# Patient Record
Sex: Female | Born: 1935 | Race: White | Hispanic: No | Marital: Married | Smoking: Never smoker
Health system: Southern US, Community
[De-identification: ages and names within clinical notes are randomized; demographics above are authoritative.]

## PROBLEM LIST (undated history)

## (undated) DIAGNOSIS — I2510 Atherosclerotic heart disease of native coronary artery without angina pectoris: Secondary | ICD-10-CM

## (undated) DIAGNOSIS — I639 Cerebral infarction, unspecified: Secondary | ICD-10-CM

## (undated) DIAGNOSIS — F0390 Unspecified dementia without behavioral disturbance: Secondary | ICD-10-CM

## (undated) DIAGNOSIS — F419 Anxiety disorder, unspecified: Secondary | ICD-10-CM

## (undated) DIAGNOSIS — I4891 Unspecified atrial fibrillation: Secondary | ICD-10-CM

---

## 1998-08-13 ENCOUNTER — Other Ambulatory Visit: Admission: RE | Admit: 1998-08-13 | Discharge: 1998-08-13 | Payer: Self-pay | Admitting: *Deleted

## 1999-08-19 ENCOUNTER — Other Ambulatory Visit: Admission: RE | Admit: 1999-08-19 | Discharge: 1999-08-19 | Payer: Self-pay | Admitting: *Deleted

## 1999-09-09 ENCOUNTER — Ambulatory Visit (HOSPITAL_COMMUNITY): Admission: RE | Admit: 1999-09-09 | Discharge: 1999-09-09 | Payer: Self-pay | Admitting: *Deleted

## 2000-10-06 HISTORY — PX: CORONARY ARTERY BYPASS GRAFT: SHX141

## 2000-11-18 ENCOUNTER — Encounter: Admission: RE | Admit: 2000-11-18 | Discharge: 2000-11-18 | Payer: Self-pay | Admitting: Internal Medicine

## 2000-11-18 ENCOUNTER — Encounter: Payer: Self-pay | Admitting: Internal Medicine

## 2001-03-02 ENCOUNTER — Encounter: Payer: Self-pay | Admitting: Emergency Medicine

## 2001-03-03 ENCOUNTER — Encounter: Payer: Self-pay | Admitting: *Deleted

## 2001-03-03 ENCOUNTER — Inpatient Hospital Stay (HOSPITAL_COMMUNITY): Admission: EM | Admit: 2001-03-03 | Discharge: 2001-03-11 | Payer: Self-pay | Admitting: Emergency Medicine

## 2001-03-04 ENCOUNTER — Encounter: Payer: Self-pay | Admitting: Surgery

## 2001-03-05 ENCOUNTER — Encounter: Payer: Self-pay | Admitting: Surgery

## 2001-03-06 ENCOUNTER — Encounter: Payer: Self-pay | Admitting: Surgery

## 2001-03-07 ENCOUNTER — Encounter: Payer: Self-pay | Admitting: Cardiothoracic Surgery

## 2001-03-08 ENCOUNTER — Encounter: Payer: Self-pay | Admitting: Cardiothoracic Surgery

## 2001-06-08 ENCOUNTER — Encounter (HOSPITAL_COMMUNITY): Admission: RE | Admit: 2001-06-08 | Discharge: 2001-09-06 | Payer: Self-pay | Admitting: *Deleted

## 2002-01-13 ENCOUNTER — Encounter: Payer: Self-pay | Admitting: Cardiology

## 2002-01-13 ENCOUNTER — Inpatient Hospital Stay (HOSPITAL_COMMUNITY): Admission: EM | Admit: 2002-01-13 | Discharge: 2002-01-14 | Payer: Self-pay | Admitting: Emergency Medicine

## 2002-05-18 ENCOUNTER — Encounter (INDEPENDENT_AMBULATORY_CARE_PROVIDER_SITE_OTHER): Payer: Self-pay | Admitting: *Deleted

## 2002-05-18 ENCOUNTER — Ambulatory Visit (HOSPITAL_COMMUNITY): Admission: RE | Admit: 2002-05-18 | Discharge: 2002-05-18 | Payer: Self-pay | Admitting: Gastroenterology

## 2003-01-30 ENCOUNTER — Ambulatory Visit (HOSPITAL_COMMUNITY): Admission: RE | Admit: 2003-01-30 | Discharge: 2003-01-30 | Payer: Self-pay | Admitting: Internal Medicine

## 2004-04-23 ENCOUNTER — Ambulatory Visit (HOSPITAL_COMMUNITY): Admission: RE | Admit: 2004-04-23 | Discharge: 2004-04-23 | Payer: Self-pay | Admitting: Internal Medicine

## 2004-11-20 ENCOUNTER — Encounter: Admission: RE | Admit: 2004-11-20 | Discharge: 2004-11-20 | Payer: Self-pay | Admitting: Internal Medicine

## 2005-05-23 ENCOUNTER — Emergency Department (HOSPITAL_COMMUNITY): Admission: EM | Admit: 2005-05-23 | Discharge: 2005-05-23 | Payer: Self-pay | Admitting: Family Medicine

## 2005-08-07 ENCOUNTER — Encounter: Admission: RE | Admit: 2005-08-07 | Discharge: 2005-08-07 | Payer: Self-pay | Admitting: Internal Medicine

## 2005-08-25 ENCOUNTER — Encounter: Admission: RE | Admit: 2005-08-25 | Discharge: 2005-08-25 | Payer: Self-pay | Admitting: Internal Medicine

## 2005-09-17 ENCOUNTER — Ambulatory Visit (HOSPITAL_COMMUNITY): Admission: RE | Admit: 2005-09-17 | Discharge: 2005-09-17 | Payer: Self-pay | Admitting: *Deleted

## 2006-07-13 ENCOUNTER — Encounter: Admission: RE | Admit: 2006-07-13 | Discharge: 2006-07-13 | Payer: Self-pay | Admitting: Internal Medicine

## 2006-12-04 ENCOUNTER — Emergency Department (HOSPITAL_COMMUNITY): Admission: EM | Admit: 2006-12-04 | Discharge: 2006-12-04 | Payer: Self-pay | Admitting: Emergency Medicine

## 2007-01-06 ENCOUNTER — Ambulatory Visit: Payer: Self-pay | Admitting: Vascular Surgery

## 2007-02-22 ENCOUNTER — Encounter: Admission: RE | Admit: 2007-02-22 | Discharge: 2007-02-22 | Payer: Self-pay | Admitting: Internal Medicine

## 2007-03-02 ENCOUNTER — Encounter: Admission: RE | Admit: 2007-03-02 | Discharge: 2007-03-02 | Payer: Self-pay | Admitting: Internal Medicine

## 2007-03-22 ENCOUNTER — Ambulatory Visit: Payer: Self-pay | Admitting: Vascular Surgery

## 2007-07-16 ENCOUNTER — Ambulatory Visit: Payer: Self-pay | Admitting: Vascular Surgery

## 2007-07-29 ENCOUNTER — Ambulatory Visit: Payer: Self-pay | Admitting: Cardiology

## 2007-08-04 ENCOUNTER — Ambulatory Visit: Payer: Self-pay

## 2007-08-04 ENCOUNTER — Encounter: Payer: Self-pay | Admitting: Cardiology

## 2007-09-14 ENCOUNTER — Ambulatory Visit: Payer: Self-pay | Admitting: Cardiology

## 2007-09-28 ENCOUNTER — Ambulatory Visit: Payer: Self-pay

## 2007-10-07 ENCOUNTER — Inpatient Hospital Stay (HOSPITAL_COMMUNITY): Admission: EM | Admit: 2007-10-07 | Discharge: 2007-10-09 | Payer: Self-pay | Admitting: Emergency Medicine

## 2007-10-07 ENCOUNTER — Ambulatory Visit: Payer: Self-pay | Admitting: Cardiology

## 2007-11-04 ENCOUNTER — Ambulatory Visit: Payer: Self-pay | Admitting: Cardiology

## 2007-11-26 ENCOUNTER — Ambulatory Visit: Payer: Self-pay | Admitting: Cardiology

## 2008-01-01 ENCOUNTER — Emergency Department (HOSPITAL_COMMUNITY): Admission: EM | Admit: 2008-01-01 | Discharge: 2008-01-01 | Payer: Self-pay | Admitting: Emergency Medicine

## 2008-01-16 ENCOUNTER — Emergency Department (HOSPITAL_COMMUNITY): Admission: EM | Admit: 2008-01-16 | Discharge: 2008-01-16 | Payer: Self-pay | Admitting: Emergency Medicine

## 2008-06-20 ENCOUNTER — Ambulatory Visit: Payer: Self-pay | Admitting: Cardiology

## 2008-08-08 ENCOUNTER — Emergency Department (HOSPITAL_COMMUNITY): Admission: EM | Admit: 2008-08-08 | Discharge: 2008-08-08 | Payer: Self-pay | Admitting: Emergency Medicine

## 2008-08-24 ENCOUNTER — Ambulatory Visit: Payer: Self-pay | Admitting: Cardiology

## 2008-08-29 ENCOUNTER — Ambulatory Visit: Payer: Self-pay | Admitting: Cardiology

## 2008-08-29 ENCOUNTER — Ambulatory Visit: Payer: Self-pay

## 2008-09-05 ENCOUNTER — Ambulatory Visit: Payer: Self-pay | Admitting: Cardiology

## 2008-09-27 ENCOUNTER — Ambulatory Visit: Payer: Self-pay | Admitting: Cardiology

## 2008-12-09 ENCOUNTER — Emergency Department (HOSPITAL_COMMUNITY): Admission: EM | Admit: 2008-12-09 | Discharge: 2008-12-09 | Payer: Self-pay | Admitting: Emergency Medicine

## 2009-06-27 ENCOUNTER — Encounter: Payer: Self-pay | Admitting: Cardiology

## 2009-06-27 DIAGNOSIS — R42 Dizziness and giddiness: Secondary | ICD-10-CM

## 2009-06-27 DIAGNOSIS — I10 Essential (primary) hypertension: Secondary | ICD-10-CM | POA: Insufficient documentation

## 2009-06-27 DIAGNOSIS — I080 Rheumatic disorders of both mitral and aortic valves: Secondary | ICD-10-CM

## 2009-06-27 DIAGNOSIS — Z951 Presence of aortocoronary bypass graft: Secondary | ICD-10-CM | POA: Insufficient documentation

## 2009-06-27 DIAGNOSIS — I359 Nonrheumatic aortic valve disorder, unspecified: Secondary | ICD-10-CM | POA: Insufficient documentation

## 2009-06-27 DIAGNOSIS — I079 Rheumatic tricuspid valve disease, unspecified: Secondary | ICD-10-CM | POA: Insufficient documentation

## 2009-06-27 DIAGNOSIS — I6529 Occlusion and stenosis of unspecified carotid artery: Secondary | ICD-10-CM

## 2009-06-27 DIAGNOSIS — I498 Other specified cardiac arrhythmias: Secondary | ICD-10-CM

## 2009-06-27 DIAGNOSIS — Z8679 Personal history of other diseases of the circulatory system: Secondary | ICD-10-CM

## 2009-06-27 DIAGNOSIS — I2510 Atherosclerotic heart disease of native coronary artery without angina pectoris: Secondary | ICD-10-CM | POA: Insufficient documentation

## 2009-06-29 ENCOUNTER — Ambulatory Visit: Payer: Self-pay | Admitting: Cardiology

## 2009-07-10 ENCOUNTER — Ambulatory Visit: Payer: Self-pay

## 2009-07-10 ENCOUNTER — Encounter: Payer: Self-pay | Admitting: Cardiology

## 2010-01-03 ENCOUNTER — Encounter: Admission: RE | Admit: 2010-01-03 | Discharge: 2010-01-03 | Payer: Self-pay | Admitting: Orthopedic Surgery

## 2010-01-08 ENCOUNTER — Encounter: Admission: RE | Admit: 2010-01-08 | Discharge: 2010-01-08 | Payer: Self-pay | Admitting: Orthopedic Surgery

## 2010-02-16 ENCOUNTER — Emergency Department (HOSPITAL_COMMUNITY): Admission: EM | Admit: 2010-02-16 | Discharge: 2010-02-16 | Payer: Self-pay | Admitting: Family Medicine

## 2010-05-13 ENCOUNTER — Encounter: Admission: RE | Admit: 2010-05-13 | Discharge: 2010-05-13 | Payer: Self-pay | Admitting: Orthopedic Surgery

## 2010-06-07 ENCOUNTER — Encounter: Payer: Self-pay | Admitting: Cardiology

## 2010-06-11 ENCOUNTER — Ambulatory Visit: Payer: Self-pay | Admitting: Cardiology

## 2010-06-11 DIAGNOSIS — E785 Hyperlipidemia, unspecified: Secondary | ICD-10-CM | POA: Insufficient documentation

## 2010-06-13 ENCOUNTER — Telehealth: Payer: Self-pay | Admitting: Cardiology

## 2010-06-18 ENCOUNTER — Telehealth: Payer: Self-pay | Admitting: Cardiology

## 2010-06-19 ENCOUNTER — Telehealth: Payer: Self-pay | Admitting: Cardiology

## 2010-07-24 ENCOUNTER — Encounter: Payer: Self-pay | Admitting: Cardiology

## 2010-07-25 ENCOUNTER — Ambulatory Visit: Payer: Self-pay

## 2010-07-25 ENCOUNTER — Encounter: Payer: Self-pay | Admitting: Cardiology

## 2010-07-26 ENCOUNTER — Encounter: Payer: Self-pay | Admitting: Cardiology

## 2010-10-27 ENCOUNTER — Encounter: Payer: Self-pay | Admitting: Orthopedic Surgery

## 2010-11-07 NOTE — Progress Notes (Signed)
Summary: re shot  Phone Note Call from Patient   Caller: Patient Reason for Call: Talk to Nurse Summary of Call: pt calling to see what kind of shot she needs and when she ca have it done-pls call [phone] Initial call taken by: Glynda Jaeger,  June 19, 2010 3:23 PM  Follow-up for Phone Call        pt aware that Herbert Seta spoke with Amberlee (granddaughter) yesterday but nothing documented about pt needing any type of shot.  Pt states she will contact her granddaughter to see what was discussed but would like for Herbert Seta to call her tomorrow.  Pt aware I will send information to Meredith Staggers, RN Follow-up by: Charolotte Capuchin, RN,  June 19, 2010 3:31 PM  Additional Follow-up for Phone Call Additional follow up Details #1::        spoke w/pt she advised there is no shot Dr Myrtis Ser had discussed coumadin w/her but decided it would be too high risk, she doesn't need to come back to see him for 1 year Meredith Staggers, RN  June 20, 2010 1:08 PM

## 2010-11-07 NOTE — Progress Notes (Signed)
Summary: simva change to prava/pt rtn call  Phone Note Outgoing Call Call back at [facility] Phone [phone]   Call placed by: Meredith Staggers, RN,  June 13, 2010 4:25 PM Call placed to: Patient Summary of Call: Dr Myrtis Ser had wanted to change pts simva to pravastatin 80 and repeat labs in 6 weeks, she left without getting this info on 9/6, have called pt and left mess to call back  Follow-up for Phone Call        pt rtn your call Caroline Goodman  June 14, 2010 8:17 AM   Left message to call back Meredith Staggers, RN  June 14, 2010 11:06 AM   pt returning call. couldnt find you Caroline Goodman  June 14, 2010 3:18 PM  Additional Follow-up for Phone Call Additional follow up Details #1::        pt aware of med change, new rx sent in, will mail her a letter in 6 weeks to recheck labs Meredith Staggers, RN  June 14, 2010 4:41 PM     New/Updated Medications: PRAVASTATIN SODIUM 80 MG TABS (PRAVASTATIN SODIUM) Take one tablet by mouth daily at bedtime Prescriptions: PRAVASTATIN SODIUM 80 MG TABS (PRAVASTATIN SODIUM) Take one tablet by mouth daily at bedtime  #30 x 6   Entered by:   Meredith Staggers, RN   Authorized by:   Talitha Givens, MD, [facility]   Signed by:   Meredith Staggers, RN on 06/14/2010   Method used:   Electronically to        [facility]  [phone]* (retail)       [address] [place], [place]  [address]       Ph: [phone] or [phone]       Fax: [phone]   RxID:   [id number]

## 2010-11-07 NOTE — Assessment & Plan Note (Signed)
Summary: yearly/sl   Visit Type:  Follow-up Primary Bereket Gernert:  Kirby Funk, MD  CC:  hypertension / atrial fibrillation.  History of Present Illness: The patient is seen for followup of hypertension and atrial fibrillation.  She's feeling well.  She does not notice significant palpitations.  She works daily.  She is very active.  She is not having chest pain.  There is no syncope or presyncope.  Historically my records suggest that the patient had refused Coumadin in the past.  I discussed this with her. She does not remember saying that she did not want to use it in the past.   The patient has a history of hypertension.  She is 77 years of age.  She therefore has enough risk factors to recommend Coumadin.  Current Medications (verified): 1)  Omeprazole 20 Mg Cpdr (Omeprazole) .... Take One Tablet By Mouth Once Daily. 2)  Multivitamins   Tabs (Multiple Vitamin) .... Take One Tablet By Mouth Once Daily. 3)  Hydrochlorothiazide 12.5 Mg Tabs (Hydrochlorothiazide) .... Take One Tablet By Mouth Daily. 4)  Simvastatin 40 Mg Tabs (Simvastatin) .... Take One Tablet By Mouth Daily At Bedtime 5)  Namenda 10 Mg Tabs (Memantine Hcl) .... Take One Tablet By Mouth Once Daily. 6)  Diltiazem Hcl Er Beads 240 Mg Xr24h-Cap (Diltiazem Hcl Er Beads) .... Take One Capsule By Mouth Daily 7)  Amitriptyline Hcl 10 Mg Tabs (Amitriptyline Hcl) .... Take One Tablet By Mouth Once Daily. 8)  Tylenol Arthritis Pain 650 Mg Cr-Tabs (Acetaminophen) .... As Needed 9)  Sm Folic Acid 400 Mcg Tabs (Folic Acid) .... Take One Tablet By Mouth Once Daily.  Allergies (verified): No Known Drug Allergies  Past History:  Past Medical History: carotid artery disease..Doppler December 2009.. moderate plaque bilaterally right greater than left..40-59% bilateral stenoses.Marland Kitchen occlusion right vertebral artery  /   Doppler.. October, 2010... 40-59% bilateral ICA ( right greater than left).... total occlusion right vertebral  artery Hypertension Atrial fibrillation Bradycardia.....slow AF rate Coumadin ?  patient refuses  /  2011...Marland KitchenMarland KitchenI discussed with her and decided not to recommend Mitral regurgitation.....moderate...echo  2008 Tricuspid regurgitation...moderate.Marland Kitchen echo  07/2007 Coronary artery disease...myoview  08/2008..EF  71%..no scar or ischemia CABG... 2002 Dr. Laneta Simmers EF 60%...echo 07/2007 Aortic valve sclerosis...echo..2008 chronic dizziness...mild headaches...mild Hyperlipidemia Arthritis neck and shoulder.... 2011  Review of Systems       Patient denies fever, chills, headache, sweats, rash, change in vision, change in hearing, chest pain, cough, nausea vomiting, urinary symptoms.  All other systems are reviewed and are negative.  Vital Signs:  Patient profile:   77 year old female Height:      62 inches Weight:      126 pounds BMI:     23.13 Pulse rate:   73 / minute BP sitting:   154 / 70  (left arm) Cuff size:   regular  Vitals Entered By: Hardin Negus, RMA (June 11, 2010 9:18 AM)  Physical Exam  General:  patient is quite stable. Head:  head is atraumatic. Eyes:  no xanthelasma. Neck:  no jugular venous distention.  No carotid bruits. Chest Wall:  no chest wall tenderness. Lungs:  lungs are clear.  Respiratory effort is not labored. Heart:  cardiac exam reveals S1 and S2.  There is a soft systolic murmur. Abdomen:  abdomen is soft. Msk:  no musculoskeletal deformities. Extremities:  no peripheral edema. Skin:  no skin rashes. Psych:  patient is oriented to person time and place.  Affect is normal.  Impression & Recommendations:  Problem # 1:  DIZZINESS (ICD-780.4) Patient is not having any dizziness.  No further workup.  Problem # 2:  AORTIC VALVE SCLEROSIS (ICD-424.1)  Her updated medication list for this problem includes:    Hydrochlorothiazide 12.5 Mg Tabs (Hydrochlorothiazide) .Marland Kitchen... Take one tablet by mouth daily. There is history of aortic valve sclerosis.   Murmur is very soft.  Plan for an echo next year.  Problem # 3:  CAD (ICD-414.00)  Her updated medication list for this problem includes:    Diltiazem Hcl Er Beads 240 Mg Xr24h-cap (Diltiazem hcl er beads) .Marland Kitchen... Take one capsule by mouth daily Coronary disease is stable.  She did not have any significant chest pain.  EKG is done today and reviewed by me.  There is atrial fibrillation with a controlled rate.  There are nonspecific ST-T wave changes.  No further workup.  Problem # 4:  MITRAL REGURGITATION (ICD-396.3) Mitral regurgitation has been stable.  We will plan a followup echo in one year.  Problem # 5:  * COUMADIN...REFUSES I carefully discussed the issue of Coumadin with the patient today.  I was leaning towards starting Coumadin.  However at the end of our visit I was not comfortable that she had a good understanding of the pros and cons of using the drug.  My overall feeling was that it might not be safe for her.  As of today I chose not to start  Problem # 6:  ATRIAL FIBRILLATION, HX OF (ICD-V12.59)  Her updated medication list for this problem includes:    Diltiazem Hcl Er Beads 240 Mg Xr24h-cap (Diltiazem hcl er beads) .Marland Kitchen... Take one capsule by mouth daily  Orders: EKG w/ Interpretation (93000) Atrial fibrillation controlled.  No change in therapy.  Problem # 7:  CAROTID ARTERY DISEASE (ICD-433.10) The patient's carotid disease is followed very carefully.  Her most recent Doppler showed no significant change.  Problem # 8:  DYSLIPIDEMIA (ICD-272.4)  Her updated medication list for this problem includes:    Simvastatin 40 Mg Tabs (Simvastatin) .Marland Kitchen... Take one tablet by mouth daily at bedtime The patient is on 40 mg of simvastatin on diltiazem.  With the current FDA recommendations her diltiazem would have to be lowered to 10 mg daily.  I have chosen therefore to change her to Pravachol 80 mg daily.  She will have followup lipid and liver in 6 weeks.  Patient  Instructions: 1)  Stop Simvastatin 2)  Start Pravachol 80mg  daily 3)  Your physician recommends that you return for a FASTING lipid and liver profile: in 6 weeks 4)  Your physician recommends that you schedule a follow-up appointment in: 1 year

## 2010-11-07 NOTE — Progress Notes (Signed)
Summary: grndtr has questions  Phone Note Call from Patient   Caller: Arlyss Repress [phone] or [phone] Reason for Call: Talk to Nurse Summary of Call: pt's grandtr calling re a shot pt said we wanted to give her while she was here-wanting to know what it is for and if she still needs it she can bring her back and has a question re taking pravastatin and simvastatin Initial call taken by: Glynda Jaeger,  June 18, 2010 11:04 AM  Follow-up for Phone Call        spoke w/Amberlee Meredith Staggers, RN  June 18, 2010 3:30 PM

## 2010-11-07 NOTE — Letter (Signed)
Summary: lipid reminder  [facility], Main Office  [address]   [place], [place] [address]   Phone: [phone]  Fax: [phone]        July 26, 2010 MRN: [id number]    [facility] [address] [place], [place]  [address]    Dear Ms. Kyllonen,  Our records indicate it is time to check your cholesterol due to your medication change.  Please call our office to schedule an appt for labwork.  Please remember it is a fasting lab.     Sincerely,  Meredith Staggers, RN Willa Rough, MD  This letter has been electronically signed by your physician.

## 2010-11-07 NOTE — Miscellaneous (Signed)
Summary: Orders Update  Clinical Lists Changes  Orders: Added new Test order of Carotid Duplex (Carotid Duplex) - Signed 

## 2010-11-07 NOTE — Miscellaneous (Signed)
  Clinical Lists Changes  Observations: Added new observation of PAST MED HX: carotid artery disease..Doppler December 2009.. moderate plaque bilaterally right greater than left..40-59% bilateral stenoses.Marland Kitchen occlusion right vertebral artery  /   Doppler.. October, 2010... 40-59% bilateral ICA ( right greater than left).... total occlusion right vertebral artery Hypertension Atrial fibrillation Bradycardia.....slow AF rate Coumadin ?  patient refuses Mitral regurgitation.....moderate...echo  2008 Tricuspid regurgitation...moderate.Marland Kitchen echo  07/2007 Coronary artery disease...myoview  08/2008..EF  71%..no scar or ischemia CABG... 2002 Dr. Laneta Simmers EF 60%...echo 07/2007 Aortic valve sclerosis...echo..2008 chronic dizziness...mild headaches...mild Hyperlipidemia (06/07/2010 15:49) Added new observation of PRIMARY MD: Kirby Funk, MD (06/07/2010 15:49)       Past History:  Past Medical History: carotid artery disease..Doppler December 2009.. moderate plaque bilaterally right greater than left..40-59% bilateral stenoses.Marland Kitchen occlusion right vertebral artery  /   Doppler.. October, 2010... 40-59% bilateral ICA ( right greater than left).... total occlusion right vertebral artery Hypertension Atrial fibrillation Bradycardia.....slow AF rate Coumadin ?  patient refuses Mitral regurgitation.....moderate...echo  2008 Tricuspid regurgitation...moderate.Marland Kitchen echo  07/2007 Coronary artery disease...myoview  08/2008..EF  71%..no scar or ischemia CABG... 2002 Dr. Laneta Simmers EF 60%...echo 07/2007 Aortic valve sclerosis...echo..2008 chronic dizziness...mild headaches...mild Hyperlipidemia

## 2011-01-10 ENCOUNTER — Other Ambulatory Visit: Payer: Self-pay | Admitting: Cardiology

## 2011-02-18 NOTE — Consult Note (Signed)
NAME:  Caroline Goodman, Caroline Goodman NO.:  [id number]   MEDICAL RECORD NO.:  [id number]          PATIENT TYPE:  INP   LOCATION:  1407                         FACILITY[facility]  [facility]   PHYSICIAN:  Jonelle Sidle, MD DATE OF BIRTH:  [birth year]   DATE OF CONSULTATION:  DATE OF DISCHARGE:                                 CONSULTATION   PRIMARY CARDIOLOGIST:  Luis Abed, MD, [facility].   REASON FOR CONSULTATION:  Bradycardia.   HISTORY OF PRESENT ILLNESS:  Caroline Goodman is a pleasant 77 year old  woman followed by Dr. Myrtis Ser and last seen in the office back on September 14, 2007.  She has a history of coronary artery disease status post  coronary artery bypass grafting by Dr. Laneta Simmers in 2002 as well as  persistent atrial fibrillation being treated with aspirin and rate  control at this time.  The patient has been hesitant to take Coumadin.  She also has moderate bilateral internal carotid artery disease graded  at 40-59% by recent assessment.  Caroline Goodman is now admitted to the  hospital having presented with generally increased somnolence over the  last several days with some change in mental status and associated  episodes of dizziness and falls.  She has not had any frank syncope.  I  spoke with the patient, her husband and two daughters today and they  indicate that she has had this general change since Christmas.  She has  not had any obvious fevers at home.  She has apparently been compliant  with her medications.  Her husband helps her with this.  She does not  report having any recent chest pain.  She was evaluated in the emergency  department and noted to have heart rates in the 40s although her beta  blocker has subsequently been decreased and telemetry shows heart rates  generally in the 50s to 60s at this time.  She does state that she feels  better.  Concurrently she was also diagnosed with a urinary tract  infection and is being treated for this.  The patient's daughter  also  tells me that she is due for an evaluation by her primary care physician  next week to assess for possibly progressive dementia symptoms.   ALLERGIES:  NISOLDIPINE.   MEDICATIONS:  Her outpatient list includes:  1. Aspirin 81 mg p.o. daily.  2. Simvastatin 80 mg p.o. daily.  3. Amitriptyline.  4. Metoprolol 25 mg p.o. daily.  5. Micardis HCTZ 80/12.5 mg p.o. daily.  6. Diltiazem XR 240 mg p.o. daily.   At the present time, her metoprolol has been held.  She is also on:  1. Lovenox 40 mg subcu daily.  2. Protonix 40 mg p.o. daily.   Past medical history, social history and family history were all  reviewed in Dr. Myrtis Ser office note from July 29, 2007.  There has been  no other major interval change.  Additional medical problems include  gastroesophageal reflux disease, hyperlipidemia, venous insufficiency.   PHYSICAL EXAMINATION:  VITAL SIGNS:  Temperature 97.3 degrees, heart  rate 55 to 60 in atrial fibrillation, respirations  20, blood pressure  138/60, oxygen saturation is 98% on room air.  GENERAL APPEARANCE:  This is a normally nourished appearing woman in no  acute distress.  HEENT:  Conjunctivae and lids are normal.  Pharynx is clear.  NECK:  Supple.  No loud carotid bruits.  No elevated jugular venous  pressure.  No thyromegaly.  CHEST:  Lungs generally clear without labored breathing.  CARDIOVASCULAR:  Exam reveals an irregularly irregular rhythm.  Soft  systolic murmur is appreciated.  The second heart sound is preserved.  There is no S3, gallop or pericardial rub.  ABDOMEN:  Soft, nontender.  Normoactive bowel sounds.  EXTREMITIES:  Some venous stasis.  No frank pitting edema.  Distal  pulses are 1+.  SKIN:  Warm and dry.  MUSCULOSKELETAL:  No kyphosis is noted.  NEUROPSYCHIATRIC:  The patient is alert and oriented x3.   LABORATORY DATA:  Electrocardiogram from yesterday demonstrates coarse  atrial fibrillation with possible junctional escape rhythm at 42  beats  per minute and increased voltage consistent with left ventricular  hypertrophy associated with repolarization abnormalities.  The 2-D  echocardiography from October of this year demonstrated a left  ventricular ejection fraction of 60-65% without regional wall motion  abnormalities.  Equivocal mitral valve prolapse was noted with moderate  mitral regurgitation.  She also had mild to moderate tricuspid  regurgitation.  WBC 8.2, hemoglobin 13.3, hematocrit 38.7, platelets  250.  Sodium 143, potassium 3.8, BUN 12, creatinine 0.8, AST 28, ALT 21.  CK 39 to 41 with normal CK-MB.  Troponin-I 0.03 to 0.02.  Urinalysis  abnormal.  Urine culture greater than 100,000 colonies per mL of  Escherichia coli.  Head CT scan from October 07, 2007 demonstrated  chronic findings as outlined in the formal report.   IMPRESSION:  1. Relative bradycardia on baseline persistent atrial fibrillation      treated with both metoprolol and Cardizem CD as well as low dose      aspirin.  It is possible that some of the patient's recent      symptomatology is related to relatively slow heart rates,      particularly if they have been persistent.  She does report feeling      better today with heart rates in the high 50s to 60s.  She does      have a concurrent urinary tract infection and also question of      possible dementia, which may also be playing a role.  Her cardiac      markers are normal and argue against an acute coronary syndrome.      She has not had any recent chest pain.  Her electrocardiogram is      relatively nonspecific.  2. Known coronary artery disease status post previous coronary artery      bypass grafting in 2002 with recently documented ejection fraction      of 60-65%.  3. Moderate mitral regurgitation and mild to moderate tricuspid      regurgitation.   RECOMMENDATIONS:  I reviewed the situation with the patient and her  family present today.  Agree with discontinuing metoprolol.   For the  time being, would continue present Cardizem CD dosing and observe heart  rate.  The patient should ambulate today.  If she shows no significant  symptomatic bradycardia, would make no further  rate medication adjustments at this time and plan a follow up visit with  Dr. Myrtis Ser in the office over the next few  weeks.  At this point doubt  need for further ischemic testing in the absence of any chest pain or  abnormal cardiac markers.  We will plan to follow with you.      Jonelle Sidle, MD  Electronically Signed     SGM/MEDQ  D:  10/08/2007  T:  10/08/2007  Job:  [id number]   cc:   Luis Abed, MD, [facility]  [address]  [address]  [place]  [place] [address]   Thora Lance, M.D.  Fax: [phone]

## 2011-02-18 NOTE — Assessment & Plan Note (Signed)
[facility]                            CARDIOLOGY OFFICE NOTE   NAME:Caroline Goodman, Caroline Goodman                      MRN:          [id number]  DATE:07/29/2007                            DOB:          06-25-1930    REASON FOR VISIT:  Establish as new patient.   HISTORY OF PRESENT ILLNESS:  Ms. Junco is a very pleasant female.  She is currently 77 years of age. She has had excellent cardiology care  over time.  Over the years she has been followed by the [facility] and most recently saw Dr. Lance Muss.  Excellent care has  been provided.  The patient's son is followed by me and at the patient's  request she has come here to be seen by me.  I made it clear that if  that is their choice, I am more than happy to help with the care.  The  patient has coronary disease. She underwent CABG by Dr. Laneta Simmers in 2002.  She is not having any recurring chest pain.  She has no significant  shortness of breath. She is actually quite active.  She gets some edema  when standing for long periods of time.  It has been recommended that  she wear support hose and she is wearing them today.  Her husband tells  me, however, that she does not wear them frequently.   At nighttime, when she lies down, she feels some palpitations.  She does  have atrial fibrillation.  The story is most compatible with her feeling  her palpitations when she is more relaxed in the quiet evening.  She has  not had any syncope or presyncope.  The atrial fibrillation has been  known.  Dr. Hoyle Barr note clearly says that he has recommended  Coumadin and she has been hesitant and unwilling to take Coumadin.  We  will talk about this over time.   PAST MEDICAL HISTORY:  Other medical problems:  See the list below.   ALLERGIES:  ? HYDROCHLOROTHIAZIDE, (but this is in one of her current  medicines and she is quite stable).   MEDICATIONS:  1. Simvastatin 80.  2. Aspirin 81.  3.  Amitriptyline.  4. Metoprolol 25 daily.  5. Micardis/hydrochlorothiazide 80/12.5.  6. Diltiazem XR 240.   SOCIAL HISTORY:  The patient and her husband live here in [place].  She is married, as noted by her husband being here.  She does not smoke.   FAMILY HISTORY:  Family history is noncontributory at this time.   REVIEW OF SYSTEMS:  She does have palpitations as mentioned.  She has  some headaches.  She has rare shortness of breath.  Otherwise, her  review of systems is negative.   PHYSICAL EXAMINATION:  VITAL SIGNS:  Blood pressure 140/60 with a pulse  of 59.  The patient has brought me home blood pressures.  Her systolic  ranges from 130 to 170 over time.  Will follow her blood pressure.  I am  hesitant to change her med's at this time.  Her atrial fibrillation rate  is on the slow  side today.  GENERAL APPEARANCE:  The patient is oriented to person, time and place.  Her affect is normal.  She is very inquisitive.  HEENT:  Reveals no xanthelasma.  She has normal extraocular motion.  NECK:  There are no carotid bruits.  There is no jugular venous  distention.  LUNGS:  Clear.  Respiratory effort is not labored.  CARDIAC:  Exam reveals an S1 with an S2.  There are no clicks or  significant murmurs.  ABDOMEN:  Soft.  There are no masses or bruits.  EXTREMITIES:  The patient is wearing her support hose.  She has no  significant peripheral edema today.  There are no major musculoskeletal  deformities.   CLINICAL DATA:  EKG reveals atrial fibrillation with a slow ventricular  response with a rate of approximately 50.   PROBLEM LIST:  I have reviewed the outside information that we obtained  from Dr. Hoyle Barr office.  Problems include:  1. Coronary disease.  2. Status post coronary artery bypass grafting in 2002 by Dr. Evelene Croon.  3. Atrial fibrillation.  The rate is slow but stable.  The patient      does not want Coumadin at this time and this will be followed       carefully.  She does not appear to be a candidate for cardioversion      at this point.  4. Hesitancy to take Coumadin.  5. History of some mild dizziness historically.  6. History of mild headaches.  7. Gastroesophageal reflux disease.  8. Hyperlipidemia on medication.  9. Mild leg swelling which is most probably venous insufficiency.  We      will obtain Doppler's that were done through Dr. Sharee Pimple office in      the past.  10.There is question of some dementia at one point in one of the prior      notes.  I do not have any documentation on this issue.  11.Carotid disease.  Doppler done in April of 2008 at Dr. Sharee Pimple      office revealed 20 to 39% right internal carotid artery and 30 to      59% left internal carotid artery.  This had not changed since June      of 2006 and she will need followup on this.   DISCUSSION:  At this point, the patient is stable.  Her #1 concern was  feeling palpitations at night and I believe that I have reassured her  that this is not of great concern; she is feeling her atrial  fibrillation.  I have recommended 2-D echocardiogram to reassess her  left ventricular  function.  We will look at her medications going forward and also obtain  any other Doppler data we can get from Dr. Sharee Pimple office.     Luis Abed, MD, [facility]  Electronically Signed    JDK/MedQ  DD: 07/29/2007  DT: 07/30/2007  Job #: [id number]   cc:   Thora Lance, M.D.  Evelene Croon, M.D.
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
## 2011-02-18 NOTE — Assessment & Plan Note (Signed)
[place] [facility]                            CARDIOLOGY OFFICE NOTE   NAME:Goodman, Caroline HIEGEL                      MRN:          [id number]  DATE:09/05/2008                            DOB:          December 30, 1929    HISTORY OF PRESENT ILLNESS:  Caroline Goodman is here for followup.  I had  seen her on 08/24/2008.  She had some chest discomfort under her left  arm and into her left breast.  Chest x-ray was done and showed no  significant abnormality.  She did have an adenosine Myoview scan.  The  study showed no scar or ischemia.  Her ejection fraction was 71%.   She returns today.  She continues to have this slight discomfort.  I  feel that it is not cardiac in origin.  I have recommended no further  workup or treatment.   PAST MEDICAL HISTORY:   ALLERGIES:  A question HYDROCHLOROTHIAZIDE.   MEDICATIONS:  See the prior list.   REVIEW OF SYSTEMS:  She has no GI or GU symptoms.  She has no fevers,  chills or headaches or skin rashes.  Review of systems is otherwise is  negative.   PHYSICAL EXAMINATION:  VITAL SIGNS:  Blood pressure is 142/54 with a  pulse of 69.  GENERAL:  The patient is oriented to person, time, and place.  She is  here with her husband.  HEENT:  Reveals no xanthelasma.  She has normal extraocular motion.  NECK:  There are no carotid bruits.  There is no jugular venous  distension.  LUNGS:  Clear.  Respiratory effort is not labored.  CARDIAC:  Reveals S1 with an S2.  There are no clicks or significant  murmurs.  ABDOMEN:  Soft.  She has no peripheral edema.   PROBLEMS:  Listed extensively on the prior notes.  The patient does have  a coronary artery disease.  She is stable.  I believe her current  symptoms is not cardiac in origin.  No further workup is needed.  I will  see her back in 6 months.     Luis Abed, MD, [facility]  Electronically Signed    JDK/MedQ  DD: 09/05/2008  DT: 09/06/2008  Job #: [id number]   cc:   Thora Lance,  M.D.

## 2011-02-18 NOTE — Assessment & Plan Note (Signed)
[facility]                            CARDIOLOGY OFFICE NOTE   NAME:Caroline Goodman                      MRN:          [id number]  DATE:06/20/2008                            DOB:          [birth year]    Caroline Goodman is doing very well.  She has atrial fibrillation.  She is  active.  She is not having any particular problems.  She bowls regularly  and in fact her current average is in the range of 165 which is  outstanding.  She is not having any chest pain.  She has full  activities.   PAST MEDICAL HISTORY:   ALLERGIES:  No allergies.   MEDICATIONS:  1. Simvastatin 40.  2. Aspirin 81.  3. Micardis.  4. Hydrochlorothiazide.  5. Diltiazem XR 240.  6. Fish oil.  7. Multivitamins.  8. Zoloft.   OTHER MEDICAL PROBLEMS:  See the list below.   REVIEW OF SYSTEMS:  She feels and looks great.  She is having no  significant problems.   PHYSICAL EXAMINATION:  VITAL SIGNS:  Weight is 131 pounds.  Blood  pressure in our office today is 180/83.  Her pulse is 76.  The patient  insists that she takes her pressure at home and that it is always  normal.  She says the elevators make her anxious.  She will follow up  with Dr. Valentina Lucks.  GENERAL:  The patient is oriented to person, time, and place.  Affect is  normal.  HEENT:  No xanthelasma.  She has normal extraocular motion.  There are  no carotid bruits.  There is no jugular venous distention.  LUNGS:  Clear.  Respiratory effort is not labored.  CARDIAC:  An S1 with an S2.  There are no clicks or significant murmurs.  The rhythm is irregularly irregular.  ABDOMEN:  Soft.  She has no peripheral edema.   EKG reveals that she does have increased voltage.  She has atrial  fibrillation with a controlled rate.   PROBLEMS:  1. Carotid artery disease.  She has followup Doppler scheduled in      December 2008.  2. Atrial fibrillation with controlled rate.  No change in her meds.      The patient refuses to  take Coumadin.  3. Elevated blood pressure today.  She insisted it is usually normal.      She will follow up with Dr. Valentina Lucks.  From my viewpoint, I can see      her back for cardiology followup in 1 year.     Caroline Abed, MD, [facility]  Electronically Signed    JDK/MedQ  DD: 06/20/2008  DT: 06/20/2008  Job #: [id number]   cc:   Thora Lance, M.D.

## 2011-02-18 NOTE — Assessment & Plan Note (Signed)
OFFICE VISIT   LETRICIA, Caroline Goodman  DOB:  [birth year]                                       07/16/2007  [id number]#:[id number]   Patient presents today for continued evaluation and discussion regarding  her mesenteric and renal artery stenoses.  I had seen her in June with a  CT angiogram.  She had some abdominal discomfort at that time, and this  is now totally resolved.  She is seen today for followup and discussion  of this.  She does keep track of her blood pressure at home and reports  that her systolic runs in the 140s to 150s generally.  She was recently  seen in Dr. Adonis Huguenin office with systolic pressure in the 190s.  She  reports this is quite unusual for her and has been started on a  different antihypertensive medication.   Today her blood pressure in our office is 101/49.  Heart rate is 43.  Blood pressure is 16.  She continues to have no evidence of abdominal  pain or mesenteric symptoms.   PHYSICAL EXAMINATION:  Unchanged.  She does not have any carotid bruits.  She does not have any abdominal bruits.  She has 2+ femoral pulses and  2+ radial pulses.  She has no abdominal tenderness.   I again reviewed her CT scan with her, since she is clearly not having  any evidence of mesenteric/ischemic type symptoms, we would not  recommend any further evaluation regarding her renal artery stenosis and  re-reviewing her CT.  She has an extremely calcified aorta and renal  artery take-off but does not have any evidence of critical stenosis by  CTA.  If she should develop progressive evidence of uncontrollable  hypertension or diminished renal function, we would  recommend a formal arteriogram.  She understands this, and we will see  her in six months with renal artery duplex and also will repeat her  carotid duplex for ongoing followup.   Larina Earthly, M.D.  Electronically Signed   TFE/MEDQ  D:  07/16/2007  T:  07/19/2007  Job:  [id number]   cc:   Thora Lance, M.D.

## 2011-02-18 NOTE — Assessment & Plan Note (Signed)
[facility]                            CARDIOLOGY OFFICE NOTE   NAME:Ruff, KAILEN NAME                      MRN:          [id number]  DATE:08/24/2008                            DOB:          [birth year]    Ms. Bonus is here today for followup.  She is also having a new  problem.  She is having pain in the left axilla that radiates towards  her left breast.  This occurs mostly at night.  She has not had a  nausea, vomiting, or diaphoresis.  She is not having any shortness of  breath.   PAST MEDICAL HISTORY:   ALLERGIES:  No significant allergies.   MEDICATIONS:  See the medication list.   REVIEW OF SYSTEMS:  She is not having any GI or GU symptoms.  She does  mention that she is having a problem with memory loss that is being  looked into.  She has not had any fevers, chills, or headaches.  Otherwise, her review of systems is negative.   PHYSICAL EXAMINATION:  VITAL SIGNS:  Blood pressure is 132/72 with a  pulse of 70.  GENERAL:  The patient is oriented to person, time, and place.  Affect is  normal.  HEENT:  No xanthelasma.  She has normal extraocular motion.  NECK:  There are no carotid bruits.  There is no jugular venous  distention.  LUNGS:  Clear.  Respiratory effort is nonlabored.  CARDIAC:  S1 and S2.  There are no clicks or significant murmurs.  ABDOMEN:  Soft.  She has no peripheral edema.  There is slight  tenderness to palpation above her left breast anterior chest.   PROBLEMS:  1. Carotid artery disease.  This is followed with carotid Dopplers.  2. Atrial fibrillation with rate control.  She refuses Coumadin.  3. Recent anterior chest discomfort.  I am not convinced this      represents ischemia.  She is very concerned about it.  She does      have vascular disease and we do need to rule out the possibility      that she is having significant ischemia.  An adenosine Myoview will      be done and she will have a chest x-ray.  I  will then see her for      followup.     Luis Abed, MD, [facility]  Electronically Signed    JDK/MedQ  DD: 08/24/2008  DT: 08/25/2008  Job #: [id number]   cc:   Thora Lance, M.D.
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
## 2011-02-18 NOTE — Consult Note (Signed)
NEW PATIENT CONSULTATION   Caroline Goodman  DOB:  Jul 23, 1930                                       03/22/2007  JSEGB#:[id number]   REASON FOR CONSULTATION:  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  She is a very pleasant 77-  year-old white female with a history of prior coronary artery bypass  graft surgery.  She has had nine months of intermittent abdominal pain  and weight loss.  She has had an extensive workup to include upper and  lower endoscopy and also CT scanning.  I am seeing her for possible  mesenteric ischemia as the cause of this.  She does report a long  history of severe epigastric burning. She reports that this can occur  with and without eating, and she does not attribute this to post-  prandial pain.  There is no food fear.  She reports that she had lost  from a normal weight of 144 pounds down to 128 pounds, but has recently  regained this back up to 130 pounds.   PAST MEDICAL HISTORY:  1. Significant for well-controlled hypertension.  2. Reflux disease.   PAST SURGICAL HISTORY:  1. Bilateral cataract surgery in the past.  2. Her heart surgery was in 2002.   SOCIAL HISTORY:  She is married, with four children.  She does not smoke  cigarettes.   PHYSICAL EXAMINATION:  GENERAL:  A well-developed and well-nourished  white female, appearing her stated age of 11.  VITAL SIGNS:  Blood pressure 132/57, pulse 79, respirations 16.  NECK:  Her carotid arteries are without bruits.  EXTREMITIES:  She has 2+ radial pulses bilaterally, 2+ femoral pulses  bilaterally and palpable pedal pulses bilaterally.  ABDOMEN:  Mild epigastric tenderness.  I do not hear any bruits.   I reviewed her CAT scan with her.  She has extensive calcification of  her aorta and all branches including the renals and mesenterics.  She  does have patent inferior mesenteric artery and bilateral renal  arteries.  I explained to her  that extensive calcification is difficult  to tell on a CT angiogram, the true extent of her stenosis.  She reports  that recently she has had marked improvement in her symptoms, and is  really having no difficulty.  Since her symptoms are not classic for  mesenteric ischemia, and since she does have three vessels patent with  questionable stenosis, I would not recommend formal arteriogram at this  time.  She also has moderate bilateral renal artery stenosis.  Again I  would not recommend arteriography unless she would develop renal  insufficiency or more difficult-to-control hypertension.  Should she  have recurrence of abdominal pain, specifically post-prandial pain, I  would recommend proceeding with an arteriography for further evaluation.  She will notify us should this occur, and otherwise we will see her in  three months for a continued discussion.   Larina Earthly, M.D.  Electronically Signed   TFE/MEDQ  D:  03/22/2007  T:  03/23/2007  Job:  [id number]   cc:   Thora Lance, M.D.

## 2011-02-18 NOTE — Assessment & Plan Note (Signed)
[facility] HEALTHCARE                            CARDIOLOGY OFFICE NOTE   NAME:Caroline Goodman, Caroline Goodman                      MRN:          [id number]  DATE:09/14/2007                            DOB:          1930-06-09    Ms. Buchta is seen for followup.  I met her first on July 29, 2007  when she established with me for her cardiology care.  I take care of  her son.  She has coronary disease, and she is post CABG in 2002 by Dr.  Evelene Croon, and she has done well.  She has atrial fibrillation.  Her  rate has been slow.  The patient repeatedly has stated she does not want  Coumadin.  We need to follow this carefully.  Ultimately, she should be  on Coumadin.  As she has some venous insufficiency, she wears support  hose.  She does have some carotid disease.  The last Dopplers I can  document were in the range of 2006.  We will repeat carotid Dopplers in  our office at this time.  If she develops any worsening findings, I will  be sure that she is seen by Dr. Tawanna Cooler Early who has been involved in her  care in the past per the patient.  She did have a 2D echo recently.  She  has good left ventricular function.  There is a question of moderate  mitral regurgitation.   She has had some mild palpitations, but these have not been significant.   PAST MEDICAL HISTORY:   ALLERGIES:  Question HYDROCHLOROTHIAZIDE.   MEDICATIONS:  1. Simvastatin.  2. Aspirin 81.  3. A pill containing amitriptyline.  4. Metoprolol.  5. Micardis.  6. Hydrochlorothiazide.  7. Diltiazem XR.  8. Fish oil.  9. Folic acid.  10.Multivitamin.   OTHER MEDICAL PROBLEMS:  See the complete list on the note of July 29, 2007.   REVIEW OF SYSTEMS:  She is really feeling well today.  Her review of  systems is negative.   PHYSICAL EXAMINATION:  Blood pressure is 130/78 with a pulse of 52.  The patient is oriented to person, time, and place.  Her affect is  normal.  HEENT:  Reveals no  xanthelasma.  She has normal extraocular  motion.  There are no carotid bruits.  There is no jugular venous distension.  LUNGS:  Clear.  Respiratory effort is not labored.  CARDIAC EXAM:  Reveals an S1 with an S2.  There is a very soft murmur of  mitral regurgitation.  The abdomen is soft.  She has no significant peripheral edema at this time.  She is wearing  her support hose.   Problems are listed on my note of July 29, 2007 with problems 1  through 11.  1. Hesitancy to take Coumadin.  We must continue to review this and      consider this over time, but I am not inclined to start it as of      today.  2. Carotid disease.  It is time for carotid Dopplers.  We will do them  in our office, and be in touch with Dr. Arbie Cookey if we are seeing any      marked changes.  3. Atrial fibrillation.  She has mild palpitations, but this is      stable.  4. Mitral regurgitation, assessed as moderate on the recent echo, and      this will be followed over time.  No changes are necessary.   Will obtain carotid Dopplers, and I will be in touch with her, and then  see her back in 6 months.     Luis Abed, MD, [facility]  Electronically Signed    JDK/MedQ  DD: 09/14/2007  DT: 09/14/2007  Job #: [id number]   cc:   Thora Lance, M.D.  Larina Earthly, M.D.

## 2011-02-21 NOTE — Op Note (Signed)
   TNAMECOLEY, LITTLES                        ACCOUNT NO.:  [id number]   MEDICAL RECORD NO.:  [id number]                   PATIENT TYPE:  AMB   LOCATION:  ENDO                                 FACILITY:  [facility]   PHYSICIAN:  Charolett Bumpers, M.D.             DATE OF BIRTH:  06-12-1930   DATE OF PROCEDURE:  05/18/2002  DATE OF DISCHARGE:                                 OPERATIVE REPORT   PROCEDURE:  Colonoscopy with polypectomy.   PROCEDURE INDICATION:  The patient is a 77 year old female, born March 20, 1930.  The patient is due for her first screening colonoscopy with  polypectomy to prevent colon cancer.  She intermittently has painless  hematochezia with anal itching.   I discussed with the patient the complications associated with colonoscopy  and polypectomy including a 15 per 1000 risk of bleeding and 4 per 1000 risk  of colon perforation requiring surgical repair.  The patient has signed the  operative permit.   ENDOSCOPIST:  Charolett Bumpers, M.D.   PREMEDICATION:  Versed 5 mg, Demerol 50 mg.   ENDOSCOPE:  Olympus pediatric colonoscope.   DESCRIPTION OF PROCEDURE:  After obtaining informed consent, the patient was  placed in the left lateral decubitus position.  I administered intravenous  Versed and intravenous Demerol to achieve conscious sedation for the  procedure.  The patient's blood pressure, oxygen saturation, and cardiac  rhythm were monitored throughout the procedure and documented in the medical  record.   Anal inspection was normal.  Digital rectal exam was normal.  The Olympus  pediatric video colonoscope was introduced into the rectum and advanced to  the cecum.  Colonic preparation for the exam today was excellent.   RECTUM:  Normal.  SIGMOID COLON AND DESCENDING COLON:  From the distal sigmoid colon at 35 cm  from the anal verge, a 2 mm sessile polyp was removed with the  electrocautery snare and submitted for pathological interpretation.  SPLENIC FLEXURE:  Normal.  TRANSVERSE COLON:  Normal.  HEPATIC FLEXURE:  Normal.  ASCENDING COLON:  Normal.  CECUM AND ILEOCECAL VALVE:  Normal.    ASSESSMENT:  From the distal sigmoid colon, a 2 mm sessile polyp was  removed; otherwise normal proctocolonoscopy to the cecum.   RECOMMENDATIONS:  Repeat colonoscopy in five years.                                               Charolett Bumpers, M.D.    MKJ/MEDQ  D:  05/18/2002  T:  05/19/2002  Job:  [id number]

## 2011-02-21 NOTE — H&P (Signed)
[place]  Patient:    Caroline Goodman, Caroline Goodman                        MRN: [id number] Adm. Date:  [id number] Attending:  Gabriel Earing CC:         Thora Lance, M.D.  Francisca December, M.D.   History and Physical  DATE OF BIRTH:  [birth year]  CHIEF COMPLAINT:  Chest pain.  HISTORY OF PRESENT ILLNESS:  Caroline Goodman is a very pleasant 77 year old female with a history of hypertension and left ventricular hypertrophy.  She also has a history of anxiety.  She presents with episodic chest pain for about two weeks.  She was also given Vioxx approximately two weeks ago for a right shoulder contusion and rotator cuff injury.  The chest pain has not been exertional.  She did see Dr. Jeremy Johann at [facility] approximately one week ago and was given sublingual nitroglycerin and a stress test was scheduled for about six weeks.  She was to use the sublingual nitroglycerin on a p.r.n. basis.  She was also started on Vioxx the week prior for right shoulder pain.  The day prior to admission, the patient started having right greater than left anterior chest discomfort.  This seemed to have soreness in both breast areas. The pain also radiated to her right scapula.  She denies nausea, vomiting, diaphoresis but has had increased belching and also some epigastric soreness. She went to a fish fry two days prior to admission and had a great deal of belching for several hours after eating fried fish.  She came to the [facility] Room because of the right greater than left chest discomfort.  She was noted to have left ventricular hypertrophy on EKG and she seemed to respond to sublingual nitroglycerin to some degree to alleviate the pain.  It is a dull ache and not sharp and nonpleuritic.  She does have a family history of coronary artery disease.  PAST MEDICAL HISTORY: 1. Hypertension. 2. Hormone replacement therapy. 3.  Anxiety.  PAST SURGICAL HISTORY:  Gravida 4, para 4, cystoscopy in 1960.  ALLERGIES:  COZAAR, HYDROCHLOROTHIAZIDE, SULAR, question of SULFA allergy.  MEDICATIONS: 1. Aspirin 325 mg a day. 2. Lopressor 50 mg p.o. b.i.d. 3. Amitriptyline 10 mg. 4. ______ tablets two p.o. b.i.d. for anxiety-is taking chronically. 5. Prempro 0.625 mg/2.5 mg one daily. 6. Aldactone 25 mg b.i.d. 7. Vioxx 25 mg at night for one to two weeks. 8. Tylenol #3 p.r.n. chest pain.  FAMILY HISTORY:  Significant for coronary artery disease, congestive heart failure and gallstones.  SOCIAL HISTORY:  The patient is married and has never been a tobacco user.  REVIEW OF SYSTEMS:  History of bronchitis in the past.  Also, history of right rotator cuff injury.  She denies dysuria or frequency.  No change in bowel habits.  Denies melena, bright red blood per rectum or light colored stools.  PHYSICAL EXAMINATION:  GENERAL:  This is a well-developed, well-nourished female who is pleasant in no acute distress.  VITAL SIGNS:  Blood pressure 158/80 but was quite hypertensive in the emergency room with systolics higher than 200.  Pulse 75 and regular, respiratory rate 20 and easy.  Temperature 97.6.  She is on two liters of nasal cannula O2 with O2 saturations of 99%.  HEENT:  She wears glasses to read but is atraumatic, normocephalic, otherwise, normal.  NECK:  Without  jugular venous distension.  Supple.  No thyromegaly.  CHEST:  Clear to auscultation.  BREASTS:  Examination is nontender, bilaterally, no masses.  CARDIAC:  Regular rate and rhythm without murmur, rub.  Positive S4 which is soft.  ABDOMEN:  Mild tenderness in the right upper quadrant and right costal margin to palpation.  There is mild tenderness in the epigastrium as well.  She is nontender over the right scapular area posteriorly where she was having pain last p.m.  She is nontender suprapubically.  EXTREMITIES:  Without cyanosis, clubbing or  edema.  NEUROLOGIC:  Oriented x 3, nonfocal.  No rashes.  Chest x-ray results are pending.  EKG in the emergency room revealed sinus bradycardia at 54.  Second degree heart block, Mobitz type I, positive LVH, nonspecific ST T-wave changes.  LABS:  White count 8700, hemoglobin 12.7, platelet count 241,000.  Pro time 13.2, INR 1.0, sodium 139, potassium 4.1, chloride 106, bicarb 25, BUN 12, creatinine 0.8.  Glucose 105, LFTs were normal.  Amylase 50, lipase 28. Initial CPK on 03/02/01 at 2156 was 58.  Second CPK at 5:40 a.m. on 02/22/01 was 50.  Troponin 0.02 x 2 and one more CPK is pending.  Urinalysis is normal except for white cells 21-50 with increased squamous cells, few bacteria. Cathed urinalysis is pending.  ASSESSMENT:  This is a 77 year old female with history of hypertension, anxiety and right rotator cuff injury with abdominal pain, chest pain seen to respond to both nitroglycerin and acid suppression.  Could be ischemic heart disease, GERD, spasm, biliary colic, costochondritis etc.  PLAN: 1. Cardiology consultation for stress Cardiolite and check serial cardiac    enzymes and continue to monitor. 2. Hold Lopressor today. 3. Continue IV nitroglycerin. 4. Epigastric pain, right upper quadrant pain - check abdominal ultrasound and    consider upper endoscopy if abdominal ultrasound and cardiac work up    unrevealing. 5. For anxiety try Xanax q.6h. p.r.n. 6. Tylenol for headache and shoulder pain. 7. Hypertension - better today, question of retrying a different ARB.  Norvasc     has been tolerated thus far. 8. Check cathed urinalysis because of pyuria.  Likely a contaminant. DD:  03/03/01 TD:  03/03/01 Job: [id number]

## 2011-02-21 NOTE — Discharge Summary (Signed)
. [facility]  Patient:    Caroline Goodman, Caroline Goodman                        MRN: [id number] Adm. Date:  [id number] Disc. Date: 03/10/01 Attending:  Cleatrice Burke Dictator:   Lissa Merlin, P.A. CC:         Meade Maw, M.D.  Dr. Krista Blue  CVTS office   Discharge Summary  ADMISSION DIAGNOSIS:  Unstable angina.  DISCHARGE DIAGNOSES: 1. Severe left main and three-vessel coronary artery disease with preserved    left ventricular function and ejection fraction. 2. Mild postoperative anemia. 3. 40-60% left internal carotid artery stenosis.  PREVIOUS MEDICAL HISTORY: 1. Left ventricular hypertrophy. 2. Hypertension. 3. Hormone replacement therapy. 4. Anxiety. 5. Shoulder pain (no previous history of CAD).  BRIEF HISTORY:  The patient is a 77 year old white female with no previous cardiac history, except for left ventricular hypertrophy.  She initially presented with new onset unstable anginal symptoms.  She also initially complained of GERD-like symptoms with belching and epigastric soreness.  She was admitted to the hospital and abdominal ultrasound was obtained and lipase and amylase were ordered to rule out pancreatitis.  This was ruled out. Attention was then turned to cardiac origin pain.  A stress echo was done on Mar 03, 2001, which was abnormal.  This led to a cardiac catheterization which showed severe three-vessel disease.  Dr. Laneta Simmers was consulted after reviewing the catheterization data and examining Ms. Duke.  He agreed that CABG was the best treatment.  Risks, benefits, details, and alternatives were discussed and it was agreed to proceed.  Routine preoperative studies and laboratories were obtained.  A preoperative Doppler exam on Mar 04, 2001, showed a 40-60% left ICA stenosis with normal palpable pulses in the lower extremities.  She underwent CABG x6 on Mar 05, 2001.  There were no complications.  She was taken to SICU in stable  condition.  Postoperatively, Ms. Allaire did well with routine care.  There were no major complications.  She was transferred to unit 2000 on postoperative day #3.  She has remained afebrile.  Vital signs have been stable.  She has been in sinus rhythm.  She is back to her preoperative weight.  She is ambulating without difficulty, taking p.o. well, bowel and bladder functioning well.  Her physical examination is satisfactory.  Her wounds are healing well.  It is anticipated she should be suitable for discharge home pending satisfactory morning rounds on Wednesday, March 10, 2001.  PROCEDURES: 1. Abdominal ultrasound on Mar 03, 2001. 2. Stress echocardiogram on Mar 03, 2001. 3. Cardiac catheterization Mar 04, 2001. 4. Preoperative Doppler studies Mar 04, 2001. 5. Coronary artery bypass graft x6 on Mar 05, 2001, with the following    grafts:    a. Lema to LAD sequential saphenous vein graft from PD to PL-2 to PL-3.    b. Saphenous vein graft to diagonal.    c. Saphenous vein graft to obtuse marginal.  MEDICATIONS: 1. Enteric coated aspirin 325 mg 1 p.o. q.d. 2. Aldactone 25 mg 1 p.o. b.i.d. 3. Lopressor 50 mg 1 p.o. b.i.d. 4. Nu-Iron 150 mg 1 p.o. q.d. 5. Percocet 5/325 one to two p.o. q.4-6h. p.r.n. for pain. 6. Amitriptyline 20 mg p.o. b.i.d. 7. Pepcid 20 mg 1 p.o. q.h.s. 8. Prempro 0.625/2.5 mg 1 p.o. q.d. 9. Colace 100 mg 2 tablets p.o. q.d. while on iron.  ALLERGIES:  COZAAR, HCTZ, SULAR, and SULFA DRUGS.  CONDITION:  Stable.  SPECIAL INSTRUCTIONS:  Ms. Borgwardt is told to do no heavy lifting over 10 pounds, no strenuous activity, no driving, no working.  She is to walk daily and use her incentive spirometry daily.  She is told she can shower.  She is to clean her wounds gently daily with mild soap and water and to be alert for increasing redness, swelling, drainage, or fever, and to call the office if she notices any problems with her wounds or has any problems at all.   She is told to get a chest x-ray when she sees Dr. Fraser Din in two weeks and to bring it with her to see Dr. Laneta Simmers.  FOLLOW-UP: 1. She is to call Dr. Fraser Din to arrange a two-week appointment. 2. She will see Dr. Laneta Simmers on Tuesday, June 18, at 10:00 a.m. DD:  03/09/01 TD:  03/10/01 Job: [id number]

## 2011-02-21 NOTE — H&P (Signed)
[place]. [facility]  Patient:    Caroline Goodman, Caroline Goodman Visit Number: [id number] MRN: [id number]          Service Type: MED Location: 2000 2004 01 Attending Physician:  Meade Maw A Dictated by:   Anselm Lis, N.P. Admit Date:  01/13/2002                           History and Physical  PRIMARY CARE CARDIOLOGIST:  Dr. Meade Maw.  PRIMARY CARE Anber Mckiver:  Dr. Thora Lance.  IMPRESSION:  (As dictated by Dr. Francisca December.)  1. Atypical chest pain, right-sided, consistent with muscular/skeletal     etiology.  Left anterior chest achiness which seems relieved with     sublingual nitrates x3.  The patient has nonspecific electrocardiographic     abnormalities which are not changed serially and may be consistent with     hypertensive heart disease.  Her first set of heart enzymes are negative.     She a known history of coronary artery disease, status post coronary     artery bypass graft one year earlier, at which time she presented with     extremely atypical chest discomfort and coronary artery disease was only     ascertained after extensive workup for other etiologies.  2. History of dyslipidemia; on Lipitor.  3. Hypertension with blood pressure of 187/93 in the emergency room upon     presentation (she had administered Micardis dose earlier).  PLAN:  (As dictated by Dr. Corliss Marcus.)  Admit to telemetry for rule out MI protocol with serial cardiac enzymes and daily EKGs.  If EKGs are unchanged and she rules out, options would include outpatient Cardiolite (no room on schedule at [facility]).  If she rules in, would proceed for heart catheterization.  HISTORY OF PRESENT ILLNESS:  Caroline Goodman is a very pleasant 77 year old female who is status post CABG x6 one year earlier.  Her angina was atypical prior to CABG, presenting as a right anterior chest discomfort exacerbated with emotional upset.  The patient now presents with a one-week  history of right arm discomfort exacerbated by movement of that right arm and it radiates to her left posterior shoulder.  She also has radiation to right anterior chest/axillary area.  Approximately three days earlier, she developed left anterior chest achiness/soreness which waxed and waned and seemed exacerbating with emotional stress.  She took three sublingual nitrates today with relief and without reoccurrence of left anterior soreness.  She denies shortness of breath, diaphoresis nor nausea.  She feels okay otherwise.  She is currently discomfort-free.  Her first set of cardiac enzymes and other lab work are okay.  EKG reveals nonspecific changes.  There are no comparison EKGs since bypass surgery.  The patient did take her Celebrex this morning without significant change in right arm discomfort.  She has been avoiding Celebrex as she was told it could exacerbate her hypertension.  PREVIOUS MEDICAL HISTORY:  1. CABG x6 (May 2002), Dr. Alleen Borne, with LIMA to the LAD; sequential     SVG to posterior descending, posterolateral #2 and posterolateral #3; SVG     to diagonal; SVG to OM.  She had postoperative atrial fibrillation which     has been nonrecurrent.  She had a short course of Coumadin post bypass.  2. History of anxiety disorder.  3. History of hypertension with elevated blood pressure in the ER.  4.  Dyslipidemia.  5. GERD.  6. Extra-cerebrovascular disease with some moderate left internal carotid     artery stenosis by carotid Dopplers ultrasound.  PREVIOUS SURGICAL HISTORY:  CABG.  ALLERGIES:  To COZAAR, HYDROCHLOROTHIAZIDE and SULAR causing palm itching/feet swelling.  MEDICATIONS:  A. Aspirin 81 mg p.o. q.d.  B. Micardis 40 mg p.o. q.d.  C. Centrum Silver one p.o. q.d.  D. Lasix 40 mg p.o. q.d.  E. Amitriptyline/perphenazine 10/2 mg.  F. Nitroglycerin spray 0.4 mg sublingual p.r.n. chest pain (only taken this     morning).  G. Celebrex 200 mg p.o. q.d.  (first dose this morning -- has not taken it     prior to this for a while).  H. Tylenol No. 3 p.r.n. headache.  I. Lipitor 40 mg p.o. q.d.  J. Famotidine 20 mg p.o. q.h.s.  SOCIAL HISTORY:  She has been married for 48 years.  Tobacco:  Negative. ETOH:  Rare social.  She is a Futures trader.  She has four children, alive and well.  FAMILY HISTORY:  Mother died at age 11 of CHF.  Father died at age 83, alcoholism/TB.  She has a 31 year old brother with "angina."  REVIEW OF SYSTEMS:  No arthritic-type complaints except this right arm at presentation.  History of right wrist carpal tunnel.  Negative palpitations, PND, DOE.  Prior history of right lower extremity swelling post bypass from venous graft harvest site which has resolved.  PHYSICAL EXAMINATION:  (As performed by Dr. Corliss Marcus.)  VITAL SIGNS:  Blood pressure 187/93, respiratory rate 20, temperature 97.0, heart rate 105, pulse oximetry on room air 99%.  GENERAL:  She is a well-nourished, pleasantly conversant 77 year old female in no apparent distress.  Her husband and daughter are in attendance.  HEENT/NECK:  Brisk bilateral carotid upstroke without bruit.  No significant JVD nor thyromegaly.  CHEST:  Lung sounds clear except for scant bibasilar crackles.  Negative CVA tenderness.  CARDIAC:  Regular rhythm without murmur, rub nor gallop.  ABDOMEN:  Soft, nondistended, normoactive bowel sounds.  Negative abdominal aortic, renal nor femoral bruit.  Nontender to applied pressure.  No masses nor organomegaly appreciated.  EXTREMITIES:  Distal pulses intact.  Negative pedal edema.  NEUROLOGIC:  Cranial nerves II-XII grossly intact, alert and oriented x3.  GENITAL:  Deferred.  RECTAL:  Deferred.  LABORATORY TESTS AND DATA:  EKG revealed NSR at 98 beats per minute with first-degree A-V block, nonspecific ST-T wave abnormalities including J point downsloping with associated T wave abnormalities, I, aVL, II, V5 and  V6. "Old" septal MI.  ST-T wave abnormalities in V2.  There is no EKG to compare  this with since her bypass graft surgery.  No change in serial EKGs in the emergency room.  First CK of 54, MB fraction 2, first troponin I of 0.02.  Hemoglobin 13.9, hematocrit of 39, WBC of 6.8, platelets are 223,000; differential okay. Sodium 143, potassium of 3.5, chloride 104, CO2 of 31, BUN of 8, creatinine 0.6, glucose of 114.  LFTs within normal range.   Chest x-ray revealed no active disease. Dictated by:   Anselm Lis, N.P. Attending Physician:  Mora Appl DD:  01/13/02 TD:  01/14/02 Job: [phone] [id number]

## 2011-02-21 NOTE — Discharge Summary (Signed)
[name]. [facility]  Patient:    Caroline Goodman, Caroline Goodman                        MRN: [id number] Adm. Date:  [id number] Disc. Date: [id number] Attending:  Cleatrice Burke Dictator:   Liane Comber, P.A. CC:         CVTS Office c/o Dr. Ronne Binning, M.D.  Halford Decamp, M.D.  Gretta Arab Valentina Lucks, M.D.   Discharge Summary  ADDENDUM  The patient was initially for discharge on March 10, 2001, although the evening prior she went into rapid atrial fibrillation with rapid ventricular response. The patient was then put on an IV Cardizem drip.  She converted to normal sinus rhythm.  The IV Cardizem was discontinued.  She was also put on digoxin and her Lopressor dose was increased to 75 mg twice a day and she was started on Coumadin 5 mg and she will have her PT/INR followed up in Dr. Faythe Ghee office.  To have her first blood draw on March 12, 2001.  The patient is to see Dr. Fraser Din in the office on June 19 and then is to follow up with Dr. Laneta Simmers in three weeks and our office will call with that appointment for her. DD:  03/11/01 TD:  03/12/01 Job: [id number]

## 2011-02-21 NOTE — Op Note (Signed)
[place]. [facility]  Patient:    Caroline Goodman, Caroline Goodman                        MRN: [id number] Proc. Date: 03/05/01 Adm. Date:  [id number] Attending:  Cleatrice Burke CC:         Meade Maw, M.D.  [facility]   Operative Report  PREOPERATIVE DIAGNOSIS:  Left main and severe three-vessel coronary artery disease with unstable angina.  POSTOPERATIVE DIAGNOSIS:  Left main and severe three-vessel coronary artery disease with unstable angina.  PROCEDURES:  Median sternotomy, extracorporeal circulation, coronary artery bypass graft surgery x 6 using a left internal mammary artery graft to the left anterior descending coronary artery, with a saphenous vein graft to the diagonal branch of the left anterior descending coronary artery, a saphenous vein graft to the obtuse marginal branch of the left circumflex coronary artery, and a triple sequential saphenous vein graft to the posterior descending and second and third posterolateral branches of the right coronary artery.  SURGEON:  Alleen Borne, M.D.  ASSISTANT:  Dominica Severin, P.A.  ANESTHESIA:  General endotracheal.  CLINICAL HISTORY:  This patient is a 77 year old woman in previously good health, who presented with new-onset unstable anginal symptoms.  Cardiac catheterization showed a hazy 80% left main stenosis.  In addition, there are high-grade stenoses in the LAD, left circumflex, and right coronary arteries. Left ventricular function was well-preserved.  After review of the angiograms and examination of the patient, it was felt that coronary artery bypass graft surgery was the best treatment.  I discussed the operative procedure with the patient and her husband and daughter, including alternatives, benefits, and risks, including bleeding, possible blood transfusion, infection, stroke, myocardial infarction, and death.  They understood and agreed to proceed.  DESCRIPTION OF  PROCEDURE:  The patient was taken to the operating room and placed on the table in the supine position.  After induction of general endotracheal anesthesia, a Foley catheter was placed in the bladder using sterile technique.  Then the chest, abdomen, and both lower extremities were prepped and draped in the usual sterile manner.  The chest was entered through a median sternotomy incision and the pericardium opened in the midline. Examination of the heart showed good ventricular contractility.  The ascending aorta had no palpable plaques in it.  Then the left internal mammary artery was harvested from the chest wall as a pedicle graft.  This was a medium-caliber vessel with excellent blood flow through it.  At the same time, a segment of greater saphenous vein was harvested from the right leg, and this vein was of medium size and good quality.  Then the patient was heparinized and when an adequate activated clotting time was achieved, the distal ascending aorta was cannulated using a 20 French aortic cannula for arterial inflow.  Venous outflow was achieved using a two-stage venous cannula through the right atrial appendage.  An antegrade cardioplegia and vent cannula was inserted in the aortic root.  The patient was placed on cardiopulmonary bypass and the distal coronaries identified.  The LAD was crossclamped.  Cold blood antegrade cardioplegia 500 cc was administered in the aortic root with quick arrest of the heart. Systemic hypothermia at 20 degrees Centigrade and topical hypothermia with iced saline was used.  A temperature probe was placed in the septum and an insulating pad in the pericardium.  The first distal anastomosis was performed to the posterior descending coronary  artery.  The internal diameter is 1.6 mm.  The conduit used was a segment of greater saphenous vein and the anastomosis performed in a sequential side-to-side manner using continuous 7-0 Prolene suture.  Flow  was noted through the graft and was excellent.  The second distal anastomosis was performed to the second posterolateral branch.  The internal diameter of this vessel was 1.6 mm.  The conduit used was the same segment of greater saphenous vein and the anastomosis performed in a sequential side-to-side manner using continuous 7-0 Prolene suture.  Flow was noted through the graft and was excellent.  The third distal anastomosis was performed to the _____ posterolateral branch. The internal diameter was about 1.5 mm.  The conduit used was the same segment of greater saphenous vein and the anastomosis performed in a sequential end-to-side manner using continuous 7-0 Prolene suture.  Flow was noted through the graft and was excellent.  Another dose of cardioplegia was given down the vein graft and in the aortic root.  The fourth distal anastomosis was performed to the obtuse marginal branch. The internal diameter was 1.6 mm.  The conduit used was a second segment of greater saphenous vein and the anastomosis performed in an end-to-side manner using continuous 7-0 Prolene suture.  Flow was noted through the graft and was excellent.  The fifth distal anastomosis was performed to the diagonal branch.  The internal diameter is 1.75 mm.  The conduit used was a third segment of greater saphenous vein and the anastomosis performed in an end-to-side manner using continuous 7-0 Prolene suture.  Flow was noted through the graft and was excellent.  The sixth distal anastomosis was performed to the midportion of the left anterior descending coronary artery.  The internal diameter of this vessel was 2.5 mm.  The conduit used was a left internal mammary graft, and this was brought through an opening in the left pericardium anterior to the phrenic nerve.  It was anastomosed to the LAD in end-to-side manner using continuous 8-0 Prolene suture.  The pedicle was tacked to the epicardium with 6-0  Prolene  sutures.  The patient rewarmed to 37 degrees Centigrade and the clamp removed from the mammary pedicle.  There was rapid warming of the ventricular septum and return of spontaneous ventricular fibrillation.  The crossclamp was removed with a time of 68 minutes and the patient defibrillated into sinus rhythm.  A partial occlusion clamp was placed on the aortic root, and three proximal vein graft anastomoses were performed in an end-to-side manner using continuous 6-0 Prolene suture.  The clamp was removed, the vein grafts de-aired, and the clamps removed from them.  The proximal and distal anastomoses appeared hemostatic and the alignment of the grafts satisfactory. Graft markers were placed around the proximal anastomoses.  Two temporary right ventricular and right atrial pacing wires were placed and brought through the skin.  When the patient had rewarmed to 37 degrees Centigrade, she was weaned from cardiopulmonary bypass on no inotropic agents.  Total bypass time was 106 minutes.  Cardiac function appeared excellent with a cardiac output of 5 L/min.  Protamine was given, and the venous and aortic cannulas were removed without difficulty.  Hemostasis was achieved.  Three chest tubes were placed with two in the posterior pericardium, one in the left pleural space, and one in the anterior mediastinum.  The pericardium was reapproximated over the heart.  The sternum was closed with #6 stainless steel wires.  Fascia was closed using continuous #1 Vicryl  suture.  Subcutaneous tissue was closed using continuous 2-0 Vicryl and the skin with 3-0 Vicryl subcuticular closure.  Lower extremity vein harvest site was closed in layers in a similar manner. Sponge, needle, and instrument counts were correct according to the scrub nurse.  Dry sterile dressings were applied over the incisions and around the chest tubes, which were hooked to Pleuravac suction.  The patient  remained hemodynamically stable and was transported to the SICU in guarded but stable condition. DD:  03/05/01 TD:  03/05/01 Job: [id number]

## 2011-06-26 LAB — URINALYSIS, ROUTINE W REFLEX MICROSCOPIC
Bilirubin Urine: NEGATIVE
Glucose, UA: NEGATIVE
Hgb urine dipstick: NEGATIVE
Nitrite: POSITIVE — AB
Protein, ur: 30 — AB
Specific Gravity, Urine: 1.027
Urobilinogen, UA: 1
pH: 6.5

## 2011-06-26 LAB — CARDIAC PANEL(CRET KIN+CKTOT+MB+TROPI)
Relative Index: INVALID
Relative Index: INVALID
Total CK: 39
Troponin I: 0.03

## 2011-06-26 LAB — CBC
HCT: 38.7
Platelets: 250
RDW: 14.1

## 2011-06-26 LAB — COMPREHENSIVE METABOLIC PANEL
Albumin: 3.5
Alkaline Phosphatase: 52
BUN: 12
Potassium: 3.8
Sodium: 143
Total Protein: 6.5

## 2011-06-26 LAB — TROPONIN I: Troponin I: 0.02

## 2011-06-26 LAB — CK TOTAL AND CKMB (NOT AT ARMC): CK, MB: 1.3

## 2011-06-26 LAB — URINE CULTURE: Colony Count: >=100000

## 2011-06-26 LAB — DIFFERENTIAL
Basophils Relative: 1
Lymphs Abs: 2.3
Monocytes Absolute: 0.6
Monocytes Relative: 8
Neutro Abs: 5

## 2011-06-30 LAB — POCT CARDIAC MARKERS
CKMB, poc: <1 — ABNORMAL LOW
Myoglobin, poc: 45.6
Operator id: 1192
Troponin i, poc: <0.05

## 2011-07-01 LAB — COMPREHENSIVE METABOLIC PANEL
AST: 34
Albumin: 4.5
BUN: 10
CO2: 29
Calcium: 10.1
Creatinine, Ser: 0.66
GFR calc Af Amer: >60
GFR calc non Af Amer: >60

## 2011-07-01 LAB — CBC
HCT: 42.7
MCHC: 34.4
MCV: 96.1
Platelets: 243

## 2011-07-01 LAB — URINALYSIS, ROUTINE W REFLEX MICROSCOPIC
Ketones, ur: NEGATIVE
Nitrite: NEGATIVE
Protein, ur: NEGATIVE

## 2011-07-01 LAB — DIFFERENTIAL
Basophils Absolute: 0.1
Eosinophils Relative: 2
Lymphocytes Relative: 29
Lymphs Abs: 2.3
Neutro Abs: 4.8

## 2011-07-08 LAB — POCT CARDIAC MARKERS
CKMB, poc: <1 — ABNORMAL LOW
Troponin i, poc: <0.05

## 2011-07-08 LAB — URINALYSIS, ROUTINE W REFLEX MICROSCOPIC
Glucose, UA: NEGATIVE
Ketones, ur: NEGATIVE
Nitrite: NEGATIVE
Specific Gravity, Urine: 1.005
pH: 7

## 2011-07-08 LAB — APTT: aPTT: 31

## 2011-07-08 LAB — POCT I-STAT, CHEM 8
Creatinine, Ser: 0.7
HCT: 37
Hemoglobin: 12.6
Potassium: 3.6
Sodium: 142
TCO2: 26

## 2011-07-08 LAB — DIFFERENTIAL
Basophils Absolute: 0
Eosinophils Relative: 2
Lymphocytes Relative: 35
Lymphs Abs: 2.5
Monocytes Absolute: 0.5
Monocytes Relative: 7
Neutro Abs: 4

## 2011-07-08 LAB — URINE MICROSCOPIC-ADD ON

## 2011-07-08 LAB — CBC
HCT: 38.3
Hemoglobin: 13.1
RBC: 4.04
RDW: 13.3

## 2011-07-08 LAB — B-NATRIURETIC PEPTIDE (CONVERTED LAB): Pro B Natriuretic peptide (BNP): 217 — ABNORMAL HIGH

## 2011-07-30 ENCOUNTER — Other Ambulatory Visit: Payer: Self-pay | Admitting: Cardiology

## 2011-09-01 ENCOUNTER — Other Ambulatory Visit: Payer: Self-pay | Admitting: Internal Medicine

## 2011-09-01 DIAGNOSIS — F0390 Unspecified dementia without behavioral disturbance: Secondary | ICD-10-CM

## 2011-09-03 ENCOUNTER — Ambulatory Visit
Admission: RE | Admit: 2011-09-03 | Discharge: 2011-09-03 | Disposition: A | Discharge status: Home / Self Care | Payer: Medicare Other | Source: Ambulatory Visit | Attending: Internal Medicine | Admitting: Internal Medicine

## 2011-09-03 DIAGNOSIS — F0390 Unspecified dementia without behavioral disturbance: Secondary | ICD-10-CM

## 2011-09-03 MED ORDER — IOHEXOL 300 MG/ML  SOLN
75.0000 mL | Freq: Once | INTRAMUSCULAR | Status: AC | PRN
  Administered 2011-09-03: 75 mL via INTRAVENOUS

## 2011-11-07 ENCOUNTER — Emergency Department (HOSPITAL_COMMUNITY): Payer: Medicare Other

## 2011-11-07 ENCOUNTER — Emergency Department (HOSPITAL_COMMUNITY)
Admission: EM | Admit: 2011-11-07 | Discharge: 2011-11-07 | Disposition: A | Discharge status: Home / Self Care | Payer: Medicare Other | Attending: Emergency Medicine | Admitting: Emergency Medicine

## 2011-11-07 ENCOUNTER — Encounter (HOSPITAL_COMMUNITY): Payer: Self-pay | Admitting: *Deleted

## 2011-11-07 ENCOUNTER — Other Ambulatory Visit: Payer: Self-pay

## 2011-11-07 DIAGNOSIS — M545 Low back pain, unspecified: Secondary | ICD-10-CM | POA: Insufficient documentation

## 2011-11-07 DIAGNOSIS — M542 Cervicalgia: Secondary | ICD-10-CM | POA: Insufficient documentation

## 2011-11-07 DIAGNOSIS — R079 Chest pain, unspecified: Secondary | ICD-10-CM | POA: Insufficient documentation

## 2011-11-07 DIAGNOSIS — M549 Dorsalgia, unspecified: Secondary | ICD-10-CM | POA: Insufficient documentation

## 2011-11-07 DIAGNOSIS — I4891 Unspecified atrial fibrillation: Secondary | ICD-10-CM | POA: Insufficient documentation

## 2011-11-07 DIAGNOSIS — I2510 Atherosclerotic heart disease of native coronary artery without angina pectoris: Secondary | ICD-10-CM | POA: Insufficient documentation

## 2011-11-07 DIAGNOSIS — W19XXXA Unspecified fall, initial encounter: Secondary | ICD-10-CM | POA: Insufficient documentation

## 2011-11-07 DIAGNOSIS — R404 Transient alteration of awareness: Secondary | ICD-10-CM | POA: Insufficient documentation

## 2011-11-07 DIAGNOSIS — Z8679 Personal history of other diseases of the circulatory system: Secondary | ICD-10-CM | POA: Insufficient documentation

## 2011-11-07 DIAGNOSIS — F0390 Unspecified dementia without behavioral disturbance: Secondary | ICD-10-CM | POA: Insufficient documentation

## 2011-11-07 HISTORY — DX: Unspecified dementia, unspecified severity, without behavioral disturbance, psychotic disturbance, mood disturbance, and anxiety: F03.90

## 2011-11-07 HISTORY — DX: Anxiety disorder, unspecified: F41.9

## 2011-11-07 HISTORY — DX: Cerebral infarction, unspecified: I63.9

## 2011-11-07 HISTORY — DX: Atherosclerotic heart disease of native coronary artery without angina pectoris: I25.10

## 2011-11-07 HISTORY — DX: Unspecified atrial fibrillation: I48.91

## 2011-11-07 LAB — URINALYSIS, ROUTINE W REFLEX MICROSCOPIC
Glucose, UA: NEGATIVE mg/dL
Hgb urine dipstick: NEGATIVE
pH: 8 (ref 5.0–8.0)

## 2011-11-07 LAB — CBC
HCT: 39.1 % (ref 36.0–46.0)
Hemoglobin: 14.4 g/dL (ref 12.0–15.0)
MCH: 33.3 pg (ref 26.0–34.0)
RBC: 4.32 MIL/uL (ref 3.87–5.11)

## 2011-11-07 LAB — BASIC METABOLIC PANEL
BUN: 12 mg/dL (ref 6–23)
Calcium: 10.1 mg/dL (ref 8.4–10.5)
Creatinine, Ser: 0.76 mg/dL (ref 0.50–1.10)
GFR calc non Af Amer: 78 mL/min — ABNORMAL LOW (ref >90)
Glucose, Bld: 126 mg/dL — ABNORMAL HIGH (ref 70–99)

## 2011-11-07 LAB — URINE MICROSCOPIC-ADD ON

## 2011-11-07 MED ORDER — POTASSIUM CHLORIDE CRYS ER 20 MEQ PO TBCR
40.0000 meq | EXTENDED_RELEASE_TABLET | Freq: Once | ORAL | Status: AC
  Administered 2011-11-07: 40 meq via ORAL
  Filled 2011-11-07: qty 2

## 2011-11-07 NOTE — ED Notes (Signed)
Pt placed in gown, on monitor with continuous blood pressure and pulse oximetry 

## 2011-11-07 NOTE — ED Notes (Signed)
To ed via ems for eval after possibly tripping, falling, and hitting head on the wall. Brief LOC per nursing home staff. Awake and alert now

## 2011-11-07 NOTE — ED Provider Notes (Signed)
Medical screening examination/treatment/procedure(s) were conducted as a shared visit with non-physician practitioner(s) and myself.  I personally evaluated the patient during the encounter  Has a history of dementia. She had a a fall where she fell to her knees and then struck her head on a wall. She states she was walking home to [place].  Patient had no complaints of pain. She denies chest pain on palpation. Lungs were clear. Regular rate. Found to have atrial flutter on EKG. No edema. No back tenderness.  Head CT, CT C-spine, chest x-ray, screening labs. Likely discharge home  Dayton Bailiff, MD 11/07/11 1544

## 2011-11-07 NOTE — ED Provider Notes (Signed)
History     CSN: [id number]  Arrival date & time 11/07/11  1205   First MD Initiated Contact with Patient 11/07/11 1210      Chief Complaint  Patient presents with  . Fall    (Consider location/radiation/quality/duration/timing/severity/associated sxs/prior treatment) HPI Comments: Patient with a history of dementia, coronary artery disease, stroke, and atrial fibrillation presents to the emergency room after a fall.  History obtained from EMS who picked up a patient from [facility]. Event was unwitnessed and possible loss of consciousness occurred.  Unable to perform proper review of system question due to patient's dementia.  On removal of spine board patient stated she had tenderness along her spine.  Patient is a 78 y.o. female presenting with fall. The history is provided by the patient.  Fall    Past Medical History  Diagnosis Date  . Dementia   . Anxiety   . Coronary artery disease   . Stroke   . A-fib     History reviewed. No pertinent past surgical history.  History reviewed. No pertinent family history.  History  Substance Use Topics  . Smoking status: Not on file  . Smokeless tobacco: Not on file  . Alcohol Use:     OB History    Grav Para Term Preterm Abortions TAB SAB Ect Mult Living                  Review of Systems  Unable to perform ROS   Allergies  Review of patient's allergies indicates no known allergies.  Home Medications   Current Outpatient Rx  Name Route Sig Dispense Refill  . ACETAMINOPHEN ER 650 MG PO TBCR Oral Take 1,300 mg by mouth every morning.    Marland Kitchen ALPRAZOLAM 0.25 MG PO TABS Oral Take 0.25 mg by mouth 2 (two) times daily as needed. For anxiety.    . ASPIRIN 81 MG PO CHEW Oral Chew 81 mg by mouth every morning.    Marland Kitchen CHLORTHALIDONE 25 MG PO TABS Oral Take 25 mg by mouth daily.    Marland Kitchen CITALOPRAM HYDROBROMIDE 10 MG PO TABS Oral Take 10 mg by mouth daily.    Marland Kitchen DILTIAZEM HCL ER COATED BEADS 360 MG PO CP24 Oral Take 360 mg by  mouth daily.    . DONEPEZIL HCL 10 MG PO TABS Oral Take 10 mg by mouth every morning.    Marland Kitchen LOSARTAN POTASSIUM 100 MG PO TABS Oral Take 100 mg by mouth every morning.    Marland Kitchen MEMANTINE HCL 10 MG PO TABS Oral Take 10 mg by mouth 2 (two) times daily.    Marland Kitchen OMEPRAZOLE 20 MG PO CPDR Oral Take 20 mg by mouth every morning.    Marland Kitchen POTASSIUM CHLORIDE ER 10 MEQ PO TBCR Oral Take 10 mEq by mouth every morning.    Marland Kitchen PRAVASTATIN SODIUM 80 MG PO TABS Oral Take 80 mg by mouth daily.    . TRAMADOL HCL 50 MG PO TABS Oral Take 50 mg by mouth 4 (four) times daily as needed. For pain.      BP 136/53  Pulse 64  Temp(Src) 98 F (36.7 C) (Oral)  Resp 15  SpO2 100%  Physical Exam  Nursing note and vitals reviewed. Constitutional: She appears well-developed and well-nourished. No distress.  HENT:  Head: Normocephalic and atraumatic.  Eyes: Conjunctivae and EOM are normal. Pupils are equal, round, and reactive to light. No scleral icterus.  Neck: Normal range of motion. Neck supple.  Cardiovascular: Normal rate, regular  rhythm, normal heart sounds and intact distal pulses.   Pulmonary/Chest: Effort normal. No respiratory distress. She has no wheezes. She has no rales. She exhibits no tenderness.  Abdominal: Soft. There is no tenderness.  Musculoskeletal: Normal range of motion. She exhibits no edema and no tenderness.       Right hip: Normal.       Left hip: Normal.       Cervical back: She exhibits bony tenderness.       Thoracic back: She exhibits bony tenderness.       Lumbar back: She exhibits bony tenderness.       No hip instability present.  No tenderness to palpation of pelvic region.  Passive range of motion of flexion and extension of lower chest trauma the use did not induce pain nor did internal and external rotation.  No tenderness to palpation of bony prominences of both upper extremities.   Neurological: She is alert. She displays a negative Romberg sign.       No facial paralysis or slurring of  speech, patient is alert and oriented to self. No past pointing, CN intact. Unable to complete thorough neuro exam d/t pts baseline dementia    Skin: Skin is warm and dry. No petechiae, no purpura and no rash noted. She is not diaphoretic.  Psychiatric: Her speech is not slurred. Cognition and memory are impaired.    ED Course  Procedures (including critical care time)  Labs Reviewed  CBC - Abnormal; Notable for the following:    WBC 12.4 (*)    MCHC 36.8 (*)    All other components within normal limits  BASIC METABOLIC PANEL - Abnormal; Notable for the following:    Potassium 3.3 (*)    Glucose, Bld 126 (*)    GFR calc non Af Amer 78 (*)    All other components within normal limits  URINALYSIS, ROUTINE W REFLEX MICROSCOPIC  URINE CULTURE   Dg Chest 2 View  11/07/2011  *RADIOLOGY REPORT*  Clinical Data: Fall.  Chest pain.  CHEST - 2 VIEW  Comparison: 08/08/2008.  Findings: No pneumothorax.  No displaced rib fractures.  Prominent bilateral nipple shadows are present.  Thoracic spinal alignment vertebral body height appears preserved.  CABG.  Aortic arch atherosclerosis.  Atherosclerosis is present.  Azygos fissure incidentally noted.  Old granulomatous calcification noted in the right lung. The cardiopericardial silhouette appears within normal limits for projection.  IMPRESSION:  1.  No acute cardiopulmonary disease. 2.  CABG.  Original Report Authenticated By: Andreas Newport, M.D.   Dg Cervical Spine Complete  11/07/2011  *RADIOLOGY REPORT*  Clinical Data: Posterior neck pain  CERVICAL SPINE - COMPLETE 4+ VIEW  Comparison: None.  Findings: No evidence for fracture.  No subluxation.  There is loss of disc height at C3-4, C4-5, C5-6, C6-7.  Trace anterolisthesis of C2 on 3 is probably related to the facet disease at this level. Straightening of the normal cervical lordosis is noted.  There is no prevertebral soft tissue swelling.  IMPRESSION: Degenerative changes without acute bony findings.   Original Report Authenticated By: ERIC A. MANSELL, M.D.   Dg Thoracic Spine 2 View  11/07/2011  *RADIOLOGY REPORT*  Clinical Data: Fall.  Back pain.  THORACIC SPINE - 2 VIEW  Comparison: None.  Findings: Two-view study shows no evidence for fracture.  No subluxation.  Intervertebral disc spaces are relatively well preserved throughout.  Frontal film shows no abnormal paraspinal line.  IMPRESSION: No acute bony findings.  Original  Report Authenticated By: ERIC A. MANSELL, M.D.   Dg Lumbar Spine Complete  11/07/2011  *RADIOLOGY REPORT*  Clinical Data: Low back pain  LUMBAR SPINE - COMPLETE 4+ VIEW  Comparison: None.  Findings: Convex leftward scoliosis noted.  No fracture is evident. Diffuse loss of intervertebral disc height is noted with associated diffuse facet degeneration.  Coarse calcification of the central pelvis is probably related to a calcified fibroid.  IMPRESSION: Scoliosis with diffuse degenerative changes.  No acute findings.  Original Report Authenticated By: ERIC A. MANSELL, M.D.   Ct Head Wo Contrast  11/07/2011  *RADIOLOGY REPORT*  Clinical Data: Larey Seat and hit head on wall.  Loss of consciousness.  CT HEAD WITHOUT CONTRAST  Technique:  Contiguous axial images were obtained from the base of the skull through the vertex without contrast.  Comparison: 09/03/2011  Findings: There is no evidence for acute hemorrhage, hydrocephalus, mass lesion, or abnormal extra-axial fluid collection.  No definite CT evidence for acute infarction.  Asymmetry of the temporal horns is stable.  Diffuse loss of parenchymal volume is consistent with atrophy. Old right occipital infarct noted. Patchy low attenuation in the deep hemispheric and periventricular white matter is nonspecific, but likely reflects chronic microvascular ischemic demyelination. The visualized paranasal sinuses and mastoid air cells are clear.  IMPRESSION: Stable.  No acute intracranial abnormality.  Original Report Authenticated By: ERIC A.  MANSELL, M.D.    Date: 11/07/2011  Rate: 48  Rhythm: stable flutter, not tachycardic  QRS Axis: normal  Intervals: normal  ST/T Wave abnormalities: normal  Conduction Disutrbances: none  Old EKG Reviewed: No significant changes noted     No diagnosis found.  Patient able to ambulate in the hallway without ataxia.  Patient's granddaughter and niece have a ride hospital stating that she appears to be at her baseline mental status.  Patient is alert and oriented x1 appears to be in no acute distress and has no current complaints.  Urinalysis pending prior to discharge.  MDM  Fall  No acute abnormalities were found radiologically.  Patient's hypokalemia was treated with oral potassium while in the emergency department and her elevated white count is likely due to her fall.  Patient is able to ambulate in the emergency department without difficulty and does not appear to be in any acute distress.  This patient was seen with Dr. Brooke Dare who agrees with my opinion to discharge patient.        Jaci Carrel, PA-C 11/07/11 [address], [place] 11/07/11 [phone]

## 2011-11-08 LAB — URINE CULTURE: Culture  Setup Time: 201302011537

## 2013-02-22 ENCOUNTER — Inpatient Hospital Stay (HOSPITAL_COMMUNITY)
Admission: EM | Admit: 2013-02-22 | Discharge: 2013-02-25 | DRG: 682 | Disposition: A | Discharge status: Hospice | Payer: Medicare Other | Attending: Internal Medicine | Admitting: Internal Medicine

## 2013-02-22 ENCOUNTER — Encounter (HOSPITAL_COMMUNITY): Payer: Self-pay

## 2013-02-22 ENCOUNTER — Inpatient Hospital Stay (HOSPITAL_COMMUNITY): Payer: Medicare Other

## 2013-02-22 ENCOUNTER — Emergency Department (HOSPITAL_COMMUNITY): Payer: Medicare Other

## 2013-02-22 DIAGNOSIS — E870 Hyperosmolality and hypernatremia: Secondary | ICD-10-CM | POA: Diagnosis present

## 2013-02-22 DIAGNOSIS — Z515 Encounter for palliative care: Secondary | ICD-10-CM

## 2013-02-22 DIAGNOSIS — G9340 Encephalopathy, unspecified: Secondary | ICD-10-CM | POA: Diagnosis present

## 2013-02-22 DIAGNOSIS — N390 Urinary tract infection, site not specified: Secondary | ICD-10-CM | POA: Diagnosis present

## 2013-02-22 DIAGNOSIS — Z8679 Personal history of other diseases of the circulatory system: Secondary | ICD-10-CM

## 2013-02-22 DIAGNOSIS — Z66 Do not resuscitate: Secondary | ICD-10-CM | POA: Diagnosis present

## 2013-02-22 DIAGNOSIS — R531 Weakness: Secondary | ICD-10-CM

## 2013-02-22 DIAGNOSIS — R5381 Other malaise: Secondary | ICD-10-CM | POA: Diagnosis present

## 2013-02-22 DIAGNOSIS — D72829 Elevated white blood cell count, unspecified: Secondary | ICD-10-CM | POA: Diagnosis present

## 2013-02-22 DIAGNOSIS — I2510 Atherosclerotic heart disease of native coronary artery without angina pectoris: Secondary | ICD-10-CM | POA: Diagnosis present

## 2013-02-22 DIAGNOSIS — G9341 Metabolic encephalopathy: Secondary | ICD-10-CM | POA: Diagnosis present

## 2013-02-22 DIAGNOSIS — E860 Dehydration: Secondary | ICD-10-CM

## 2013-02-22 DIAGNOSIS — Z8673 Personal history of transient ischemic attack (TIA), and cerebral infarction without residual deficits: Secondary | ICD-10-CM

## 2013-02-22 DIAGNOSIS — F0390 Unspecified dementia without behavioral disturbance: Secondary | ICD-10-CM | POA: Diagnosis present

## 2013-02-22 DIAGNOSIS — R1310 Dysphagia, unspecified: Secondary | ICD-10-CM | POA: Diagnosis present

## 2013-02-22 DIAGNOSIS — R4182 Altered mental status, unspecified: Secondary | ICD-10-CM

## 2013-02-22 DIAGNOSIS — I4891 Unspecified atrial fibrillation: Secondary | ICD-10-CM | POA: Diagnosis present

## 2013-02-22 DIAGNOSIS — I10 Essential (primary) hypertension: Secondary | ICD-10-CM | POA: Diagnosis present

## 2013-02-22 DIAGNOSIS — E878 Other disorders of electrolyte and fluid balance, not elsewhere classified: Secondary | ICD-10-CM | POA: Diagnosis present

## 2013-02-22 DIAGNOSIS — N179 Acute kidney failure, unspecified: Principal | ICD-10-CM | POA: Diagnosis present

## 2013-02-22 DIAGNOSIS — E876 Hypokalemia: Secondary | ICD-10-CM | POA: Diagnosis not present

## 2013-02-22 LAB — URINALYSIS, ROUTINE W REFLEX MICROSCOPIC
Nitrite: NEGATIVE
Protein, ur: 100 mg/dL — AB
Specific Gravity, Urine: 1.021 (ref 1.005–1.030)
Urobilinogen, UA: 1 mg/dL (ref 0.0–1.0)

## 2013-02-22 LAB — CBC WITH DIFFERENTIAL/PLATELET
Basophils Absolute: 0 10*3/uL (ref 0.0–0.1)
Basophils Relative: 0 % (ref 0–1)
Eosinophils Absolute: 0.1 10*3/uL (ref 0.0–0.7)
Eosinophils Relative: 1 % (ref 0–5)
HCT: 45.4 % (ref 36.0–46.0)
Hemoglobin: 14.6 g/dL (ref 12.0–15.0)
Lymphocytes Relative: 18 % (ref 12–46)
Lymphs Abs: 2.5 10*3/uL (ref 0.7–4.0)
MCH: 31.1 pg (ref 26.0–34.0)
MCHC: 32.2 g/dL (ref 30.0–36.0)
MCV: 96.8 fL (ref 78.0–100.0)
Monocytes Absolute: 0.9 10*3/uL (ref 0.1–1.0)
Monocytes Relative: 7 % (ref 3–12)
Neutro Abs: 10 10*3/uL — ABNORMAL HIGH (ref 1.7–7.7)
Neutrophils Relative %: 74 % (ref 43–77)
Platelets: 222 10*3/uL (ref 150–400)
RBC: 4.69 MIL/uL (ref 3.87–5.11)
RDW: 14.5 % (ref 11.5–15.5)
WBC: 13.6 10*3/uL — ABNORMAL HIGH (ref 4.0–10.5)

## 2013-02-22 LAB — BASIC METABOLIC PANEL
BUN: 48 mg/dL — ABNORMAL HIGH (ref 6–23)
BUN: 37 mg/dL — ABNORMAL HIGH (ref 6–23)
CO2: 25 mEq/L (ref 19–32)
Calcium: 10.1 mg/dL (ref 8.4–10.5)
Chloride: >130 mEq/L (ref 96–112)
Chloride: >130 mEq/L (ref 96–112)
Creatinine, Ser: 1.22 mg/dL — ABNORMAL HIGH (ref 0.50–1.10)
GFR calc Af Amer: 47 mL/min — ABNORMAL LOW (ref >90)
GFR calc non Af Amer: 41 mL/min — ABNORMAL LOW (ref >90)
Glucose, Bld: 121 mg/dL — ABNORMAL HIGH (ref 70–99)
Glucose, Bld: 125 mg/dL — ABNORMAL HIGH (ref 70–99)
Potassium: 3.6 mEq/L (ref 3.5–5.1)
Potassium: 3.1 mEq/L — ABNORMAL LOW (ref 3.5–5.1)
Sodium: 169 mEq/L (ref 135–145)

## 2013-02-22 LAB — CBC
Hemoglobin: 13.6 g/dL (ref 12.0–15.0)
MCH: 30.8 pg (ref 26.0–34.0)
MCV: 97.7 fL (ref 78.0–100.0)
RBC: 4.42 MIL/uL (ref 3.87–5.11)

## 2013-02-22 LAB — URINE MICROSCOPIC-ADD ON

## 2013-02-22 LAB — CREATININE, SERUM: GFR calc Af Amer: 57 mL/min — ABNORMAL LOW (ref >90)

## 2013-02-22 LAB — TROPONIN I: Troponin I: <0.3 ng/mL (ref <0.30)

## 2013-02-22 MED ORDER — SODIUM CHLORIDE 0.9 % IV BOLUS (SEPSIS)
1000.0000 mL | Freq: Once | INTRAVENOUS | Status: AC
  Administered 2013-02-22: 1000 mL via INTRAVENOUS

## 2013-02-22 MED ORDER — MEMANTINE HCL 5 MG PO TABS
5.0000 mg | ORAL_TABLET | Freq: Two times a day (BID) | ORAL | Status: DC
  Administered 2013-02-22 – 2013-02-25 (×6): 5 mg via ORAL
  Filled 2013-02-22 (×7): qty 1

## 2013-02-22 MED ORDER — ROSUVASTATIN CALCIUM 5 MG PO TABS
2.5000 mg | ORAL_TABLET | Freq: Every day | ORAL | Status: DC
  Administered 2013-02-22 – 2013-02-24 (×3): 2.5 mg via ORAL
  Filled 2013-02-22 (×4): qty 0.5

## 2013-02-22 MED ORDER — DILTIAZEM HCL 25 MG/5ML IV SOLN
15.0000 mg | Freq: Once | INTRAVENOUS | Status: AC
  Administered 2013-02-22: 15 mg via INTRAVENOUS
  Filled 2013-02-22 (×2): qty 5

## 2013-02-22 MED ORDER — SIMVASTATIN 10 MG PO TABS: 10.0000 mg | ORAL_TABLET | Freq: Every day | ORAL | Status: DC

## 2013-02-22 MED ORDER — SODIUM CHLORIDE 0.9 % IV SOLN: INTRAVENOUS | Status: DC

## 2013-02-22 MED ORDER — SODIUM CHLORIDE 0.9 % IV SOLN
INTRAVENOUS | Status: DC
  Administered 2013-02-22: 13:00:00 via INTRAVENOUS

## 2013-02-22 MED ORDER — ONDANSETRON HCL 4 MG PO TABS: 4.0000 mg | ORAL_TABLET | Freq: Four times a day (QID) | ORAL | Status: DC | PRN

## 2013-02-22 MED ORDER — DEXTROSE 5 % IV SOLN
1.0000 g | INTRAVENOUS | Status: DC
  Administered 2013-02-22 – 2013-02-23 (×2): 1 g via INTRAVENOUS
  Filled 2013-02-22 (×3): qty 10

## 2013-02-22 MED ORDER — ENOXAPARIN SODIUM 30 MG/0.3ML ~~LOC~~ SOLN
30.0000 mg | SUBCUTANEOUS | Status: DC
  Administered 2013-02-22: 30 mg via SUBCUTANEOUS
  Filled 2013-02-22 (×2): qty 0.3

## 2013-02-22 MED ORDER — DONEPEZIL HCL 10 MG PO TABS
10.0000 mg | ORAL_TABLET | Freq: Every day | ORAL | Status: DC
  Administered 2013-02-22 – 2013-02-24 (×3): 10 mg via ORAL
  Filled 2013-02-22 (×4): qty 1

## 2013-02-22 MED ORDER — DILTIAZEM HCL ER COATED BEADS 360 MG PO CP24
360.0000 mg | ORAL_CAPSULE | Freq: Every day | ORAL | Status: DC
  Administered 2013-02-23 – 2013-02-25 (×3): 360 mg via ORAL
  Filled 2013-02-22 (×3): qty 1

## 2013-02-22 MED ORDER — PRAVASTATIN SODIUM 10 MG PO TABS
10.0000 mg | ORAL_TABLET | Freq: Every day | ORAL | Status: DC
  Filled 2013-02-22: qty 1

## 2013-02-22 MED ORDER — SODIUM CHLORIDE 0.9 % IJ SOLN
3.0000 mL | Freq: Two times a day (BID) | INTRAMUSCULAR | Status: DC
  Administered 2013-02-22 – 2013-02-25 (×2): 3 mL via INTRAVENOUS

## 2013-02-22 MED ORDER — ONDANSETRON HCL 4 MG/2ML IJ SOLN: 4.0000 mg | Freq: Four times a day (QID) | INTRAMUSCULAR | Status: DC | PRN

## 2013-02-22 MED ORDER — ACETAMINOPHEN 650 MG RE SUPP: 650.0000 mg | Freq: Four times a day (QID) | RECTAL | Status: DC | PRN

## 2013-02-22 MED ORDER — ASPIRIN 81 MG PO CHEW
81.0000 mg | CHEWABLE_TABLET | Freq: Every day | ORAL | Status: DC
  Administered 2013-02-22 – 2013-02-25 (×4): 81 mg via ORAL
  Filled 2013-02-22 (×4): qty 1

## 2013-02-22 MED ORDER — ACETAMINOPHEN 325 MG PO TABS: 650.0000 mg | ORAL_TABLET | Freq: Four times a day (QID) | ORAL | Status: DC | PRN

## 2013-02-22 MED ORDER — DEXTROSE 5 % IV SOLN
INTRAVENOUS | Status: DC
Start: 2013-02-22 — End: 2013-02-25
  Administered 2013-02-22 – 2013-02-25 (×6): via INTRAVENOUS

## 2013-02-22 NOTE — ED Provider Notes (Signed)
History    79 year old female sent for evaluation decreased mental status. Patient has a history of Alzheimer's dementia and is in a memory care unit. Per her family, her baseline is that she does talk. Her speech is comprehensible although content often doesn't make sense. The past 2-3 days she's been laying in bed and sleeping much more than she typically does. She's been essentially nonverbal. Normally ambulatory and that "they sometimes have to fight her from walking around so much."  No trauma that they're aware of. No fevers or chills. No vomiting or diarrhea. No recent medication changes. Patient hasn't been eating or drinking over this time period as well. Pt hasn't verbalized any specific complaints recently although they say she may not even if something was bothering her.  CSN: [id number]  Arrival date & time 02/22/13  1010   First MD Initiated Contact with Patient 02/22/13 1029      Chief Complaint  Patient presents with  . Altered Mental Status    (Consider location/radiation/quality/duration/timing/severity/associated sxs/prior treatment) HPI  Past Medical History  Diagnosis Date  . Dementia   . Anxiety   . Coronary artery disease   . Stroke   . A-fib     No past surgical history on file.  No family history on file.  History  Substance Use Topics  . Smoking status: Not on file  . Smokeless tobacco: Not on file  . Alcohol Use:     OB History   Grav Para Term Preterm Abortions TAB SAB Ect Mult Living                  Review of Systems  All systems reviewed and negative, other than as noted in HPI.   Allergies  Review of patient's allergies indicates no known allergies.  Home Medications   Current Outpatient Rx  Name  Route  Sig  Dispense  Refill  . acetaminophen (MAPAP ARTHRITIS PAIN) 650 MG CR tablet   Oral   Take 1,300 mg by mouth every morning.         Marland Kitchen ALPRAZolam (XANAX) 0.25 MG tablet   Oral   Take 0.25 mg by mouth 2 (two) times daily  as needed. For anxiety.         Marland Kitchen aspirin 81 MG chewable tablet   Oral   Chew 81 mg by mouth every morning.         . cholecalciferol (VITAMIN D) 1000 UNITS tablet   Oral   Take 1,000 Units by mouth daily.         Marland Kitchen diltiazem (CARDIZEM CD) 360 MG 24 hr capsule   Oral   Take 360 mg by mouth daily.         Marland Kitchen docusate sodium (COLACE) 100 MG capsule   Oral   Take 100 mg by mouth daily.         Marland Kitchen donepezil (ARICEPT) 10 MG tablet   Oral   Take 10 mg by mouth every evening.          Marland Kitchen losartan (COZAAR) 50 MG tablet   Oral   Take 50 mg by mouth daily.         . memantine (NAMENDA) 5 MG tablet   Oral   Take 5 mg by mouth 2 (two) times daily.         . Menthol-Methyl Salicylate (THERA-GESIC) 1-15 % CREA   Apply externally   Apply 1 application topically at bedtime.         Marland Kitchen  omeprazole (PRILOSEC) 20 MG capsule   Oral   Take 20 mg by mouth every morning.         . pravastatin (PRAVACHOL) 10 MG tablet   Oral   Take 10 mg by mouth daily.         . sennosides-docusate sodium (SENOKOT-S) 8.6-50 MG tablet   Oral   Take 2 tablets by mouth 2 (two) times daily.         . traMADol (ULTRAM) 50 MG tablet   Oral   Take 50 mg by mouth 4 (four) times daily as needed. For pain.           BP 151/82  Pulse 74  Temp(Src) 97.4 F (36.3 C) (Axillary)  Resp 16  SpO2 98%  Physical Exam  Nursing note and vitals reviewed. Constitutional: She appears well-developed and well-nourished.  HENT:  Head: Normocephalic and atraumatic.  Eyes: Conjunctivae are normal. Pupils are equal, round, and reactive to light. Right eye exhibits no discharge. Left eye exhibits no discharge.  Neck: Neck supple.  Cardiovascular: Regular rhythm and normal heart sounds.  Exam reveals no gallop and no friction rub.   No murmur heard. Irregularly irregular. Mostly afib on monitor with rate up to 140s with brief periods of normal sinus.   Pulmonary/Chest: Effort normal and breath  sounds normal. No respiratory distress.  Abdominal: Soft. She exhibits no distension. There is no tenderness.  Musculoskeletal: She exhibits no edema and no tenderness.  No apparent bony tenderness or extremities or pain with ROM or large joints.   Neurological:  Eyes open. Not following commands. Almost catatonic. No facial droop. Good tone in all extremities and noted to move all 4. Did make a few incomprehensible sounds during exam.   Skin: Skin is warm and dry.    ED Course  Procedures (including critical care time)  Labs Reviewed  CBC WITH DIFFERENTIAL - Abnormal; Notable for the following:    WBC 13.6 (*)    Neutro Abs 10.0 (*)    All other components within normal limits  BASIC METABOLIC PANEL - Abnormal; Notable for the following:    Sodium 169 (*)    Chloride >130 (*)    Glucose, Bld 121 (*)    BUN 48 (*)    Creatinine, Ser 1.22 (*)    GFR calc non Af Amer 41 (*)    GFR calc Af Amer 47 (*)    All other components within normal limits  TROPONIN I  URINALYSIS, DIPSTICK ONLY   Ct Head Wo Contrast  02/22/2013   *RADIOLOGY REPORT*  Clinical Data: Altered mental status  CT HEAD WITHOUT CONTRAST  Technique:  Contiguous axial images were obtained from the base of the skull through the vertex without contrast. Study was obtained within 24 hours of arrival at the emergency department.  Comparison: November 07, 2011  Findings:  There is moderate diffuse atrophy with ventricles somewhat enlarged in proportion the sulci.  There is marked enlargement of the temporal horns of the lateral ventricles, particularly on the right.  There is felt to be a degree of ex vacuo phenomenon involving the right temporal horn.  There is no evidence of mass, hemorrhage, extra-axial fluid collection, or midline shift.  There is evidence of a prior infarct in the anterior, medial aspect of the right occipital lobe, stable. There is small vessel disease throughout the centra semiovale bilaterally.  There are  tiny lacunar infarcts in the left lentiform nucleus, nonacute.  No acute appearing infarct  is appreciable on this study.  Bony calvarium appears intact.  The mastoid air cells are clear.  IMPRESSION: Atrophy with questionable concomitant normal pressure hydrocephalus.  Prior infarct anteromedial right occipital lobe. Moderate supratentorial small vessel disease, stable.  Suspect a degree of ex vacuo phenomenon involving the right temporal horn of the lateral ventricle.  There is no demonstrable mass, hemorrhage, or acute appearing infarct.   Original Report Authenticated By: Bretta Bang, M.D.   EKG:  Rhythm: afib w/ rvr Rate: 130 Intervals: ST segments:   1. Mental status, decreased   2. Severe dehydration   3. AKI (acute kidney injury)   4. afib with RVR    MDM  82yF with decrease mental status. Profound dehydration. Pt has essentially not been eating/drinking for past few days. What precipitated this change is still not apparent to me. UA still pending. Difficult to obtain good neuro exam but she does seem to have focal deficit. CT head w/o acute findings. IVF. Dose cardizem given and rate improved. Admission.         Raeford Razor, MD 02/22/13 [phone]

## 2013-02-22 NOTE — Progress Notes (Signed)
Thank you for consulting the Palliative Medicine Team at [facility] to meet your patient's and family's needs.   The reason that you asked [place] to see your patient is for goals of care. We have scheduled your patient for a meeting tomorrow Wed 5/21 @ 8am The Surrogate decision maker is patient's son: Ahtziry Saathoff  [phone] Your patient is unable to participate-minimally responsive at this time.  Additional Narrative: Pt from [facility] - adm w/decreased responsiveness and decreased PO intake x several days.

## 2013-02-22 NOTE — ED Notes (Addendum)
Per lab, urine specimen has been sent out for culture.  Urine creatinine and sodium must be recollected.  Reported to Theodoro Doing who took report.

## 2013-02-22 NOTE — ED Notes (Signed)
MD at bedside. 

## 2013-02-22 NOTE — ED Notes (Signed)
Bed:WA12<BR> Expected date:<BR> Expected time:<BR> Means of arrival:<BR> Comments:<BR> EMS

## 2013-02-22 NOTE — Progress Notes (Signed)
Clinical Social Work Department BRIEF PSYCHOSOCIAL ASSESSMENT 02/22/2013  Patient:  Caroline Goodman, Caroline Goodman     Account Number:  [id number]     Admit date:  02/22/2013  Clinical Social Worker:  Doree Albee  Date/Time:  02/22/2013 02:12 PM  Referred by:  CSW  Date Referred:  02/22/2013 Referred for  ALF Placement   Other Referral:   Interview type:  Family Other interview type:   Pt daughter and pt son/hcpoa Caroline [facility]    PSYCHOSOCIAL DATA Living Status:  FACILITY Admitted from facility:  [facility] Level of care:  Assisted Living Primary support name:  Caroline Goodman [phone] Primary support relationship to patient:  CHILD, ADULT Degree of support available:   strong    CURRENT CONCERNS  Other Concerns:    SOCIAL WORK ASSESSMENT / PLAN CSW met with pt daughter Caroline Goodman in patient room. Patient unable to engage in psychosocial assessment at this time. Patient duaghter Caroline Goodman, shared patient is a resident at [facility]  in  memory care which is called [facility]. Patient daughter shared that patient son Caroline Goodman is patient HCPOA. CSW spoke with pt son Caroline Goodman, who confirmed he is HCPOA. Pt son/hcpoa confirmed that pt plans to return to [facility] to the Memory care Unit when medically stable. CSW and pt son discussed that if a higher level of care is recommended, or paitent has other disposition needs CSW and patient family would work to together to arrange approrrpatie care for patient. Patient son thanked csw for concern and supprot and is hopeful for patient ot return to [facility] when medically stable but open to recommendations.   Assessment/plan status:   Other assessment/ plan:   Information/referral to community resources:    PATIENT'S/FAMILY'S RESPONSE TO PLAN OF CARE: Patient son thanked csw for concern and supprot and is hopeful for patient ot return to [facility] when medically stable but open to recommendations.   Caroline Goodman,  LCSWA  [phone] .02/22/2013  1528pm

## 2013-02-22 NOTE — Clinical Social Work Psychosocial (Signed)
     Clinical Social Work Department BRIEF PSYCHOSOCIAL ASSESSMENT 02/22/2013  Patient:  Caroline Goodman, Caroline Goodman     Account Number:  [id number]     Admit date:  02/22/2013  Clinical Social Worker:  Doree Albee  Date/Time:  02/22/2013 02:12 PM  Referred by:  CSW  Date Referred:  02/22/2013 Referred for  ALF Placement   Other Referral:   Interview type:  Family Other interview type:   Pt daughter and pt son/hcpoa [facility]    PSYCHOSOCIAL DATA Living Status:  FACILITY Admitted from facility:  [facility] Level of care:  Assisted Living Primary support name:  Caroline Goodman [phone] Primary support relationship to patient:  CHILD, ADULT Degree of support available:   strong    CURRENT CONCERNS  Other Concerns:    SOCIAL WORK ASSESSMENT / PLAN CSW met with pt daughter Caroline Goodman in patient room. Patient unable to engage in psychosocial assessment at this time. Patient duaghter Caroline Goodman, shared patient is a resident at [facility]. Patient daughter shared that patient son Caroline Goodman is patient HCPOA. CSW spoke with pt son Caroline Goodman, who confirmed he is HCPOA. Pt son/hcpoa confirmed that pt plans to return to [facility] when medically stable. CSW and pt son discussed that if a higher level of care is recommended, or paitent has other disposition needs CSW and patient family would work to together to arrange approrrpatie care for patient. Patient son thanked csw for concern and supprot and is hopeful for patient ot return to [facility] when medically stable but open to recommendations.   Assessment/plan status:   Other assessment/ plan:   Information/referral to community resources:    PATIENTS/FAMILYS RESPONSE TO PLAN OF CARE: Patient son thanked csw for concern and supprot and is hopeful for patient ot return to [facility] when medically stable but open to recommendations.

## 2013-02-22 NOTE — ED Notes (Signed)
Critical Na 169 and Cl > 130 reported by lab and reported to Dr. Juleen China.

## 2013-02-22 NOTE — Progress Notes (Addendum)
Addendum: CSW spoke with pt hcpoa, Caroline Goodman who confirmed patient plans to return to [facility] when medically stable. Caroline Goodman stated other children of patient will be present with pt in room today, and Caroline Goodman is available at [phone] at anytime and can return to the hospital within minutes if needed.   CSW spoke with pt daughter Caroline Goodman in patient room. Patient currently in CT scan. Pt daughter Caroline Goodman confirmed patient is a resident at [facility] and plans to return when medically stable. Pt daughter confirmed patient son, Caroline Goodman is patient HCPOA and would be returning to the hospital shortly. CSW left message for pt son Caroline Goodman at [phone] continuing to follow patient to determine pt disposition needs.   Catha Gosselin, LCSWA  [phone] .02/22/2013 1108am

## 2013-02-22 NOTE — Progress Notes (Signed)
CRITICAL VALUE ALERT  Critical value received:  Sodium 165, Chloride >130  Date of notification:  02/22/2013  Time of notification:  2055  Critical value read back:yes  Nurse who received alert:  L. Vergel de Lucy Chris RN  MD notified (1st page):  Donnamarie Poag NP  Time of first page:  2105  MD notified (2nd page):  Time of second page:  Responding MD:  Donnamarie Poag NP  Time MD responded:  2110

## 2013-02-22 NOTE — ED Notes (Signed)
DNR sticker placed on pt's ID band.

## 2013-02-22 NOTE — ED Notes (Addendum)
Per EMS: Altered LOC since Sunday; pt lives in [facility] Unit. Pt has been in a somewhat catatonic state since Sunday per staff at [facility].  12 Lead- A-Fib and 18g left hand.  CBG: 114...120, 160/80 and 97% on RA.  Hx: Dementia, Alzheimers, HTN, CAD

## 2013-02-22 NOTE — H&P (Addendum)
[facility] History and Physical  Caroline Goodman GNF:[id number] DOB: 1929-12-11 DOA: 02/22/2013  Referring physician: DR. Juleen China PCP: No primary provider on file.  Specialists: Dr Myrtis Ser  Chief Complaint: brought in from ALF for decreased responsiveness and no po intake for the last few days.   HPI: Caroline Goodman is a 79 y.o. female with  H/o hypertension, CAD, afib, dementia in memory care unit , was brought to the hospital for decreased responsiveness, and decreased po intake. On arrival to ED, she was found obtunded, hypernatremic, acute renal failure, UTI. Most of the history was obtained from ED physician and family members. She is barely responsive to pain stimuli. She is being admitted to medical service for further evaluation. Family/ hcpoa wanted to pursue palliative care services at this time for goals of care.    Review of Systems: could not be obtained.   Past Medical History  Diagnosis Date  . Dementia   . Anxiety   . Coronary artery disease   . Stroke   . A-fib    Past Surgical History  Procedure Laterality Date  . Coronary artery bypass graft  2002   Social History:  reports that she has never smoked. She has never used smokeless tobacco. She reports that she does not drink alcohol or use illicit drugs. where does patient live-- SNF No Known Allergies  Family History  Problem Relation Age of Onset  . Congestive Heart Failure Mother   . Alcoholism Father     Prior to Admission medications   Medication Sig Start Date End Date Taking? Authorizing Provider  acetaminophen (MAPAP ARTHRITIS PAIN) 650 MG CR tablet Take 1,300 mg by mouth every morning.   Yes Historical Provider, MD  ALPRAZolam (XANAX) 0.25 MG tablet Take 0.25 mg by mouth 2 (two) times daily as needed. For anxiety.   Yes Historical Provider, MD  aspirin 81 MG chewable tablet Chew 81 mg by mouth every morning.   Yes Historical Provider, MD  cholecalciferol (VITAMIN D) 1000 UNITS tablet Take 1,000  Units by mouth daily.   Yes Historical Provider, MD  diltiazem (CARDIZEM CD) 360 MG 24 hr capsule Take 360 mg by mouth daily.   Yes Historical Provider, MD  docusate sodium (COLACE) 100 MG capsule Take 100 mg by mouth daily.   Yes Historical Provider, MD  donepezil (ARICEPT) 10 MG tablet Take 10 mg by mouth every evening.    Yes Historical Provider, MD  losartan (COZAAR) 50 MG tablet Take 50 mg by mouth daily.   Yes Historical Provider, MD  memantine (NAMENDA) 5 MG tablet Take 5 mg by mouth 2 (two) times daily.   Yes Historical Provider, MD  Menthol-Methyl Salicylate (THERA-GESIC) 1-15 % CREA Apply 1 application topically at bedtime.   Yes Historical Provider, MD  omeprazole (PRILOSEC) 20 MG capsule Take 20 mg by mouth every morning.   Yes Historical Provider, MD  pravastatin (PRAVACHOL) 10 MG tablet Take 10 mg by mouth daily.   Yes Historical Provider, MD  sennosides-docusate sodium (SENOKOT-S) 8.6-50 MG tablet Take 2 tablets by mouth 2 (two) times daily.   Yes Historical Provider, MD  traMADol (ULTRAM) 50 MG tablet Take 50 mg by mouth 4 (four) times daily as needed. For pain.   Yes Historical Provider, MD   Physical Exam: Filed Vitals:   02/22/13 1019 02/22/13 1310  BP: 151/82 164/90  Pulse: 74 79  Temp: 97.4 F (36.3 C) 98.7 F (37.1 C)  TempSrc: Axillary Rectal  Resp: 16 18  SpO2: 98% 100%    Constitutional: Vital signs reviewed.  Patient is a well-developed and poorly nourished  in no acute distress . She is responding to pain stimuli only, would open her eyes with sternal rub and stare, and go back to sleep. Head: Normocephalic and atraumatic Mouth: DRY mucous membranes. Eyes: PERRL, EOMI, conjunctivae normal, No scleral icterus.  Neck: Supple, Trachea midline No JVD, mass, thyromegaly,   Cardiovascular:irregular, S1 normal, S2 normal, no MRG, pulses symmetric and intact bilaterally Pulmonary/Chest: CTAB, no wheezes, rales, or rhonchi Abdominal: Soft. Non-tender, non-distended,  bowel sounds are normal, no masses, organomegaly, or guarding present.  Musculoskeletal: No joint deformities, erythema, or stiffness, Neurological:  She is obtunded, no facial asymmetry, responding only to painful stimuli. Speech and motor function could not be assessed.  Skin: Warm, dry and intact. No rash, cyanosis, or clubbing.   Labs on Admission:  Basic Metabolic Panel:  Recent Labs Lab 02/22/13 1148  NA 169*  K 3.6  CL >130*  CO2 25  GLUCOSE 121*  BUN 48*  CREATININE 1.22*  CALCIUM 10.1   Liver Function Tests: No results found for this basename: AST, ALT, ALKPHOS, BILITOT, PROT, ALBUMIN,  in the last 168 hours No results found for this basename: LIPASE, AMYLASE,  in the last 168 hours No results found for this basename: AMMONIA,  in the last 168 hours CBC:  Recent Labs Lab 02/22/13 1148  WBC 13.6*  NEUTROABS 10.0*  HGB 14.6  HCT 45.4  MCV 96.8  PLT 222   Cardiac Enzymes:  Recent Labs Lab 02/22/13 1148  TROPONINI <0.30    BNP (last 3 results) No results found for this basename: PROBNP,  in the last 8760 hours CBG: No results found for this basename: GLUCAP,  in the last 168 hours  Radiological Exams on Admission: Ct Head Wo Contrast  02/22/2013   *RADIOLOGY REPORT*  Clinical Data: Altered mental status  CT HEAD WITHOUT CONTRAST  Technique:  Contiguous axial images were obtained from the base of the skull through the vertex without contrast. Study was obtained within 24 hours of arrival at the emergency department.  Comparison: November 07, 2011  Findings:  There is moderate diffuse atrophy with ventricles somewhat enlarged in proportion the sulci.  There is marked enlargement of the temporal horns of the lateral ventricles, particularly on the right.  There is felt to be a degree of ex vacuo phenomenon involving the right temporal horn.  There is no evidence of mass, hemorrhage, extra-axial fluid collection, or midline shift.  There is evidence of a prior  infarct in the anterior, medial aspect of the right occipital lobe, stable. There is small vessel disease throughout the centra semiovale bilaterally.  There are tiny lacunar infarcts in the left lentiform nucleus, nonacute.  No acute appearing infarct is appreciable on this study.  Bony calvarium appears intact.  The mastoid air cells are clear.  IMPRESSION: Atrophy with questionable concomitant normal pressure hydrocephalus.  Prior infarct anteromedial right occipital lobe. Moderate supratentorial small vessel disease, stable.  Suspect a degree of ex vacuo phenomenon involving the right temporal horn of the lateral ventricle.  There is no demonstrable mass, hemorrhage, or acute appearing infarct.   Original Report Authenticated By: Bretta Bang, M.D.    EKG: a flutter at 130.   Assessment/Plan Active Problems: 1. Acute metabolic encephalopathy: probably secondary to hypernatremia, acute renal failure and hyperchloremia, which is probably secondary to decreased po intake from cognitively decline. Will admit her to telemetry, start her  on D5 FLUIDS and correct her sodium gradually. Her free water deficit appears to be around 4 liters. Her initial CT head did not reveal acute pathology.   2. Acute renal failure: possibly pre renal, from dehydration. Fluids and US kidney to rule out obstructive causes, urine creatinine and sodium to evaluate for FENA. UA.   3. UTI: urine cultures pending and started her on rocephin.   4. Chronic atrial fibrillation: not on anticoagulation. On cardizem for rate control.   5. Hypertension: slightly high , resume home medications except for losartan.   6. Leukocytosis: possibly from UTI.   7. Advanced Directives: per son Caroline Goodman, [facility], he wants the patient to be DNR. As per the conversation, she has been in memory care unit and her quality of life has declined. He reports that her mental status declined, and she is not able to recognize her family members at  her baseline mental status, . She had declined cognitively over the last few weeks. He wanted to pursue palliative services. Palliative care consult called for goals of care. He also did not want any heroic measures, but would want her to be treated for infection and hydrated.   8. Dementia/Altered mental status/ : will keep her NPO till we get a SLP clinical swallow evaluation when she is more awake. Hold po medications till we get a swallow eval.   8. DVT prophylaxis     Code Status: DNR Family Communication: SPOKE with son at [phone] Disposition Plan: pending   Time spent: 85 minutes.   [facility] Pager [phone]  If 7PM-7AM, please contact night-coverage [email or web address] Password [facility] 02/22/2013, 2:27 PM

## 2013-02-23 DIAGNOSIS — N390 Urinary tract infection, site not specified: Secondary | ICD-10-CM

## 2013-02-23 DIAGNOSIS — G9340 Encephalopathy, unspecified: Secondary | ICD-10-CM

## 2013-02-23 DIAGNOSIS — R531 Weakness: Secondary | ICD-10-CM

## 2013-02-23 DIAGNOSIS — R5381 Other malaise: Secondary | ICD-10-CM

## 2013-02-23 LAB — BASIC METABOLIC PANEL
Chloride: 129 mEq/L — ABNORMAL HIGH (ref 96–112)
GFR calc Af Amer: 89 mL/min — ABNORMAL LOW (ref >90)
Potassium: 3.1 mEq/L — ABNORMAL LOW (ref 3.5–5.1)
Sodium: 162 mEq/L (ref 135–145)

## 2013-02-23 LAB — TSH: TSH: 1.933 u[IU]/mL (ref 0.350–4.500)

## 2013-02-23 LAB — CBC
HCT: 42.7 % (ref 36.0–46.0)
Hemoglobin: 13.5 g/dL (ref 12.0–15.0)
RBC: 4.36 MIL/uL (ref 3.87–5.11)
WBC: 11.4 10*3/uL — ABNORMAL HIGH (ref 4.0–10.5)

## 2013-02-23 LAB — URINE CULTURE

## 2013-02-23 LAB — HEMOGLOBIN A1C: Hgb A1c MFr Bld: 5.7 % — ABNORMAL HIGH (ref <5.7)

## 2013-02-23 LAB — FOLATE RBC: RBC Folate: 594 ng/mL (ref >=366)

## 2013-02-23 LAB — VITAMIN B12: Vitamin B-12: 364 pg/mL (ref 211–911)

## 2013-02-23 MED ORDER — ENSURE COMPLETE PO LIQD
237.0000 mL | Freq: Two times a day (BID) | ORAL | Status: DC
  Administered 2013-02-23 – 2013-02-25 (×3): 237 mL via ORAL

## 2013-02-23 MED ORDER — POTASSIUM CHLORIDE CRYS ER 20 MEQ PO TBCR
40.0000 meq | EXTENDED_RELEASE_TABLET | Freq: Once | ORAL | Status: AC
  Administered 2013-02-23: 40 meq via ORAL
  Filled 2013-02-23: qty 2

## 2013-02-23 MED ORDER — ENOXAPARIN SODIUM 40 MG/0.4ML ~~LOC~~ SOLN
40.0000 mg | SUBCUTANEOUS | Status: DC
  Administered 2013-02-23 – 2013-02-24 (×2): 40 mg via SUBCUTANEOUS
  Filled 2013-02-23 (×3): qty 0.4

## 2013-02-23 MED ORDER — POTASSIUM CHLORIDE CRYS ER 20 MEQ PO TBCR
20.0000 meq | EXTENDED_RELEASE_TABLET | Freq: Once | ORAL | Status: AC
  Administered 2013-02-23: 20 meq via ORAL
  Filled 2013-02-23: qty 1

## 2013-02-23 MED ORDER — HYDRALAZINE HCL 20 MG/ML IJ SOLN: 10.0000 mg | Freq: Four times a day (QID) | INTRAMUSCULAR | Status: DC | PRN

## 2013-02-23 MED ORDER — ADULT MULTIVITAMIN W/MINERALS CH
1.0000 | ORAL_TABLET | Freq: Every day | ORAL | Status: DC
  Administered 2013-02-23 – 2013-02-25 (×3): 1 via ORAL
  Filled 2013-02-23 (×3): qty 1

## 2013-02-23 NOTE — Progress Notes (Signed)
[facility] PROGRESS NOTE  Caroline Goodman ZOX:[id number] DOB: Nov 01, 1929 DOA: 02/22/2013 PCP: No primary provider on file.  Brief narrative 79 year old female, resident of memory care unit/ALF, PMH of advanced dementia, CAD, stroke, A. fib was admitted on 02/22/13 with one week history of progressively worsening mental status changes (less responsive) and reduced by mouth intake. In the ED, obtunded,sodium 169, chloride >130, creatinine 1.22, WBC 13.6, CT head without acute findings and positive UA. Hospitalist admission was requested.    Assessment/Plan: 1. Acute metabolic encephalopathy: Likely secondary to hyponatremia and acute renal failure complicating underlying advanced dementia. Per family at bedside, mental status has significantly improved compared to admission. Treat underlying cause and monitor. 2. Hypernatremic dehydration: Likely precipitated by UTI and poor oral intake. Continue D5 water and trend daily BMP. 3. Acute renal failure: Secondary to dehydration. Improving. Continue IV fluids and monitor daily BMP. No hydronephrosis on renal ultrasound. 4. UTI: Continue IV Rocephin pending cultures. Debris in the bladder on renal ultrasound. 5. Hypokalemia: Replete and follow BMP 6. Chronic A. fib: Controlled ventricular rate. Not on anticoagulation-possibly not a candidate secondary to fall risk from advanced dementia. Continue Cardizem and low dose aspirin. 7. Dementia: Per family, mental status is improved /almost at baseline. Continue Aricept and Namenda. 8. Mild leukocytosis. 9. Hypertension: Fluctuating. Monitor. Continue Cardizem.  Code Status: DO NOT RESUSCITATE Family Communication: Discussed with patient's son Mr. Rickell Wiehe and daughter Ms. Lynn at bedside. Disposition Plan: Remains in patient. Return to primary care unit/ALF in 48-72 hours.   Consultants:  PT and OT  Speech therapy  Palliative care team  Procedures:  None  Antibiotics:  IV Rocephin  5/20 >   HPI/Subjective: Patient is pleasantly confused. Alert and recognizes and talks to her family. Per son, mental status is almost to baseline. She also had a good breakfast this morning.  Objective: Filed Vitals:   02/22/13 1626 02/22/13 2105 02/23/13 0621 02/23/13 1056  BP: 156/85 135/65 186/79 156/64  Pulse: 86 78 89   Temp: 98.2 F (36.8 C) 98 F (36.7 C) 97.8 F (36.6 C)   TempSrc: Axillary Oral Axillary   Resp: 20 22 20    Height:      Weight:      SpO2: 97% 98% 100%     Intake/Output Summary (Last 24 hours) at 02/23/13 1201 Last data filed at 02/23/13 0855  Gross per 24 hour  Intake 1021.25 ml  Output    400 ml  Net 621.25 ml   Filed Weights   02/22/13 1519 02/22/13 1605  Weight: 47 kg (103 lb 9.9 oz) 47 kg (103 lb 9.9 oz)    Exam:   General exam: Pleasant and comfortable.  Respiratory system: Clear. No increased work of breathing.  Cardiovascular system: S1 & S2 heard, irregularly irregular. No JVD, murmurs, gallops, clicks or pedal edema. Telemetry: PAF/sinus rhythm  Gastrointestinal system: Abdomen is nondistended, soft and nontender. Normal bowel sounds heard.  Central nervous system: Alert and not oriented but recognizes family. No focal neurological deficits.  Extremities: Symmetric 5 x 5 power.   Data Reviewed: Basic Metabolic Panel:  Recent Labs Lab 02/22/13 1148 02/22/13 1645 02/22/13 1959 02/23/13 0500  NA 169*  --  165* 162*  K 3.6  --  3.1* 3.1*  CL >130*  --  >130* 129*  CO2 25  --  24 25  GLUCOSE 121*  --  125* 112*  BUN 48*  --  37* 30*  CREATININE 1.22* 1.04 0.90 0.78  CALCIUM 10.1  --  9.1 9.4  MG  --  2.4  --   --   PHOS  --  3.0  --   --    Liver Function Tests: No results found for this basename: AST, ALT, ALKPHOS, BILITOT, PROT, ALBUMIN,  in the last 168 hours No results found for this basename: LIPASE, AMYLASE,  in the last 168 hours No results found for this basename: AMMONIA,  in the last 168  hours CBC:  Recent Labs Lab 02/22/13 1148 02/22/13 1645 02/23/13 0500  WBC 13.6* 12.7* 11.4*  NEUTROABS 10.0*  --   --   HGB 14.6 13.6 13.5  HCT 45.4 43.2 42.7  MCV 96.8 97.7 97.9  PLT 222 213 181   Cardiac Enzymes:  Recent Labs Lab 02/22/13 1148  TROPONINI <0.30   BNP (last 3 results) No results found for this basename: PROBNP,  in the last 8760 hours CBG:  Recent Labs Lab 02/23/13 0744  GLUCAP 113*    No results found for this or any previous visit (from the past 240 hour(s)).   Studies: Ct Head Wo Contrast  02/22/2013   *RADIOLOGY REPORT*  Clinical Data: Altered mental status  CT HEAD WITHOUT CONTRAST  Technique:  Contiguous axial images were obtained from the base of the skull through the vertex without contrast. Study was obtained within 24 hours of arrival at the emergency department.  Comparison: November 07, 2011  Findings:  There is moderate diffuse atrophy with ventricles somewhat enlarged in proportion the sulci.  There is marked enlargement of the temporal horns of the lateral ventricles, particularly on the right.  There is felt to be a degree of ex vacuo phenomenon involving the right temporal horn.  There is no evidence of mass, hemorrhage, extra-axial fluid collection, or midline shift.  There is evidence of a prior infarct in the anterior, medial aspect of the right occipital lobe, stable. There is small vessel disease throughout the centra semiovale bilaterally.  There are tiny lacunar infarcts in the left lentiform nucleus, nonacute.  No acute appearing infarct is appreciable on this study.  Bony calvarium appears intact.  The mastoid air cells are clear.  IMPRESSION: Atrophy with questionable concomitant normal pressure hydrocephalus.  Prior infarct anteromedial right occipital lobe. Moderate supratentorial small vessel disease, stable.  Suspect a degree of ex vacuo phenomenon involving the right temporal horn of the lateral ventricle.  There is no demonstrable  mass, hemorrhage, or acute appearing infarct.   Original Report Authenticated By: Bretta Bang, M.D.   US Renal  02/22/2013   *RADIOLOGY REPORT*  Clinical Data: Acute renal failure.  RENAL/URINARY TRACT ULTRASOUND COMPLETE  Comparison:  CTA abdomen 02/22/2007.  Findings:  Right Kidney:  No hydronephrosis.  Well-preserved cortex.  No shadowing calculi.  Normal size and parenchymal echotexture without focal abnormalities.  Approximately 12.0 cm in length.  Left Kidney:  Less than optimal imaging of the left kidney due to poor acoustic window. No hydronephrosis.  Well-preserved cortex. No shadowing calculi.  Normal size and parenchymal echotexture without focal abnormalities.  Approximately 9.4 cm in length.  Bladder:  Ureteral jets not visualized.  Layering debris in the dependent portion of the bladder.  IMPRESSION:  1.  Normal-appearing kidneys without evidence of hydronephrosis to suggest urinary tract obstruction. 2.  Layering debris in the urinary bladder which could be due to infection or blood.   Original Report Authenticated By: Hulan Saas, M.D.   Portable Chest 1 View  02/22/2013   *RADIOLOGY REPORT*  Clinical Data: Leukocytosis.  Evaluate for possible pneumonia.  PORTABLE CHEST - 1 VIEW  Comparison: 11/07/2011  Findings: There are changes from CABG surgery.  The cardiac silhouette is mildly enlarged.  No mediastinal or hilar masses. The lungs show mild interstitial thickening, chronic.  The lungs are also mildly hyperexpanded.  There is no infiltrate or edema. No pleural effusion or pneumothorax.  IMPRESSION: No acute cardiopulmonary disease.   Original Report Authenticated By: Amie Portland, M.D.     Additional labs:   Scheduled Meds: . aspirin  81 mg Oral Daily  . cefTRIAXone (ROCEPHIN)  IV  1 g Intravenous Q24H  . diltiazem  360 mg Oral Daily  . donepezil  10 mg Oral QHS  . enoxaparin (LOVENOX) injection  30 mg Subcutaneous Q24H  . feeding supplement  237 mL Oral BID BM  .  memantine  5 mg Oral BID  . multivitamin with minerals  1 tablet Oral Daily  . rosuvastatin  2.5 mg Oral q1800  . sodium chloride  3 mL Intravenous Q12H   Continuous Infusions: . dextrose 100 mL/hr at 02/23/13 0706    Active Problems:   HYPERTENSION   ATRIAL FIBRILLATION, HX OF   Encephalopathy acute   Leukocytosis, unspecified   UTI (lower urinary tract infection)   Acute hypernatremia   Acute renal failure   Dementia    Time spent: 40 minutes.    [facility]  [facility] Pager [phone].   If 8PM-8AM, please contact night-coverage at [email or web address], password [facility] 02/23/2013, 12:01 PM  LOS: 1 day

## 2013-02-23 NOTE — Evaluation (Signed)
Clinical/Bedside Swallow Evaluation Patient Details  Name: Caroline Goodman MRN: [id number] Date of Birth: 10-20-1929  Today's Date: 02/23/2013 Time: 4098-1191 SLP Time Calculation (min): 25 min  Past Medical History:  Past Medical History  Diagnosis Date  . Dementia   . Anxiety   . Coronary artery disease   . Stroke   . A-fib    Past Surgical History:  Past Surgical History  Procedure Laterality Date  . Coronary artery bypass graft  2002   HPI:  Caroline Goodman is a 79 y.o. female with  H/o hypertension, CAD, afib, CVA, dementia in memory care unit , was brought to the hospital for decreased responsiveness, and decreased po intake. On arrival to ED, she was found obtunded, hypernatremic, acute renal failure, UTI.  Order for swallow evaluation received.  CXR was negative for acute change.     Assessment / Plan / Recommendation Clinical Impression  Pt presents with dysphagia consistent with dementia, likely exacerbated by current medical illness.  SLP caught pt at end of her breakfast meal, being fed by nurse tech.  No s/s of aspiration noted with orange juice, oatmeal - delayed swallow apparent.  Note palliative meeting underway during evaluation.   Pt is high aspiration risk due to level of illness currently, weakness and reliance on others for feeding.  Educated sons to aspiration precautions, recommended diet modifications, compensation strategies and importance of oral care.  SLP removed pt's denture and partial and demonstated brushing of pt's gums, tongue to family.  Rec puree/thin for institutionalized feeding and allowing family to bring in very soft solids (eg donut) when visiting pt for comfort/pleaure.  SLP to follow up briefly to assure family is educated and agreeable.  SLP phoned Caroline Goodman x2 but circuits were busy upon attempts.  Will attempt to call again next date.  Thanks for this referral.     Aspiration Risk  Severe    Diet Recommendation Dysphagia 1  (Puree);Thin liquid   Liquid Administration via: Straw;Cup Medication Administration: Crushed with puree Supervision: Staff feed patient Compensations: Small sips/bites;Check for pocketing (start meal with liquids) Postural Changes and/or Swallow Maneuvers: Seated upright 90 degrees;Upright 30-60 min after meal    Other  Recommendations   TBD  Follow Up Recommendations    TBD   Frequency and Duration min 2x/week  1 week   Pertinent Vitals/Pain Afebrile, decreased    SLP Swallow Goals Patient will utilize recommended strategies during swallow to increase swallowing safety with: Total assistance   Swallow Study Prior Functional Status   at ALF, unknown    General Date of Onset: 02/23/13 HPI: ALVITA FANA is a 79 y.o. female with  H/o hypertension, CAD, afib, CVA, dementia in memory care unit , was brought to the hospital for decreased responsiveness, and decreased po intake. On arrival to ED, she was found obtunded, hypernatremic, acute renal failure, UTI.  Order for swallow evaluation received.  CXR was negative for acute change.   Type of Study: Bedside swallow evaluation Previous Swallow Assessment: none Diet Prior to this Study: Regular;Thin liquids Temperature Spikes Noted: No Respiratory Status: Room air History of Recent Intubation: No Behavior/Cognition: Alert;Doesn't follow directions;Decreased sustained attention (pt has dementia) Oral Cavity - Dentition: Dentures, top (partial lower) Self-Feeding Abilities: Total assist Patient Positioning: Upright in bed Baseline Vocal Quality: Clear Volitional Cough: Cognitively unable to elicit Volitional Swallow: Unable to elicit    Oral/Motor/Sensory Function Overall Oral Motor/Sensory Function:  (generalized weakness, no focal CN deficits overtly present)   Ice  Chips Ice chips: Not tested   Thin Liquid Thin Liquid: Impaired Presentation: Straw Oral Phase Impairments: Reduced lingual movement/coordination;Impaired  anterior to posterior transit Pharyngeal  Phase Impairments: Suspected delayed Swallow    Nectar Thick Nectar Thick Liquid: Not tested   Honey Thick Honey Thick Liquid: Not tested   Puree Puree: Impaired Presentation: Spoon Oral Phase Impairments: Impaired anterior to posterior transit;Reduced lingual movement/coordination Pharyngeal Phase Impairments: Suspected delayed Swallow   Solid   GO    Other Comments: DNT, pt was at end of meal and only solid consumed during meal was banana that was smashed by nurse tech, pt is weak and would have difficulties masticating foods       Caroline Burnet, MS [facility] SLP [phone]

## 2013-02-23 NOTE — Consult Note (Signed)
Patient Caroline Goodman      DOB: Feb 25, 1930      QMV:[id number]     Consult Note from the Palliative Medicine Team at [facility]    Consult Requested by: Dr Rondell Reams     PCP: No primary provider on file. Reason for Consultation:Clarification of GOC and options     Phone Number:None  Assessment of patients Current state: 79 yo female resident of ALF Memory Unit  admitted thru ED with h/o progresively worsening weakness and decreased cognition, poor po intake.  Found to have UTI, mild leukocytosis, hypernatremia, ARF .  PMH for CAD, stroke, a-fib. Patient is responding well to treatment.  Family is facing decisions regarding future care needs and advanced directives.   Goals of Care: 1.  Code Status: DNR/DNI   2. Scope of Treatment: 1. Vital Signs: per unit  2. Respiratory/Oxygen:as needed for treatment 3. Nutritional Support/Tube Feeds: would be carefully considered  4. Antibiotics: yes as needed for treatment 5. Blood Products:yes if needed for treatment 6. IVF: as needed for treatment 7. Review of Medications to be discontinued:continue as needed for treatment 8. Labs:as needed for treatmented 9. Telemetry: as needed for treatment   4. Disposition: Back to [facility] when medically stable, with Palliative Care Services to follow   3. Symptom Management:   1. Weakness: Continue to encourage patient to function at baseline, work with PT,  and family to be aware  of natural progression of disease and make decisions when change occurs   4. Psychosocial: Emotional support offered to family at bedsdie, although they understand the terminal diagnosis of dementia it is very hard making decsions at this time    Patient Documents Completed or Given: Document Given Completed  Advanced Directives Pkt    MOST yes   DNR    Gone from My Sight    Hard Choices yes     Brief HPI:79 yo female resident of ALF Memory Unit  admitted thru ED with h/o progresively worsening  weakness and decreased cognition, poor po intake.  Found to have UTI, mild leukocytosis, hypernatremia, ARF .  PMH for CAD, stroke, a-fib. Patient is responding well to treatment.  Family is facing decisions regarding future care needs and advanced directives.     ROS: unable to illicit    PMH:  Past Medical History  Diagnosis Date  . Dementia   . Anxiety   . Coronary artery disease   . Stroke   . A-fib      PSH: Past Surgical History  Procedure Laterality Date  . Coronary artery bypass graft  2002   I have reviewed the FH and SH and  If appropriate update it with new information. No Known Allergies Scheduled Meds: . aspirin  81 mg Oral Daily  . cefTRIAXone (ROCEPHIN)  IV  1 g Intravenous Q24H  . diltiazem  360 mg Oral Daily  . donepezil  10 mg Oral QHS  . enoxaparin (LOVENOX) injection  30 mg Subcutaneous Q24H  . memantine  5 mg Oral BID  . rosuvastatin  2.5 mg Oral q1800  . sodium chloride  3 mL Intravenous Q12H   Continuous Infusions: . dextrose 100 mL/hr at 02/23/13 0706   PRN Meds:.acetaminophen, acetaminophen, ondansetron (ZOFRAN) IV, ondansetron    BP 186/79  Pulse 89  Temp(Src) 97.8 F (36.6 C) (Axillary)  Resp 20  Ht 5' (1.524 m)  Wt 47 kg (103 lb 9.9 oz)  BMI 20.24 kg/m2  SpO2 100%   PPS: 30 %  at best   Intake/Output Summary (Last 24 hours) at 02/23/13 0946 Last data filed at 02/23/13 0855  Gross per 24 hour  Intake 1021.25 ml  Output    400 ml  Net 621.25 ml   LBM:PTA                        Physical Exam:  General: frail, elderly female, NAD HEENT:  Dry mucous membranes, no exudate Chest:   Diminished in bases, CTA CVS: RRR Abdomen: soft NT +BS Ext: without edema Neuro:pleasantly confused  Labs: CBC    Component Value Date/Time   WBC 11.4* 02/23/2013 0500   RBC 4.36 02/23/2013 0500   HGB 13.5 02/23/2013 0500   HCT 42.7 02/23/2013 0500   PLT 181 02/23/2013 0500   MCV 97.9 02/23/2013 0500   MCH 31.0 02/23/2013 0500   MCHC 31.6  02/23/2013 0500   RDW 14.3 02/23/2013 0500   LYMPHSABS 2.5 02/22/2013 1148   MONOABS 0.9 02/22/2013 1148   EOSABS 0.1 02/22/2013 1148   BASOSABS 0.0 02/22/2013 1148    BMET    Component Value Date/Time   NA 162* 02/23/2013 0500   K 3.1* 02/23/2013 0500   CL 129* 02/23/2013 0500   CO2 25 02/23/2013 0500   GLUCOSE 112* 02/23/2013 0500   BUN 30* 02/23/2013 0500   CREATININE 0.78 02/23/2013 0500   CALCIUM 9.4 02/23/2013 0500   GFRNONAA 77* 02/23/2013 0500   GFRAA 89* 02/23/2013 0500    CMP     Component Value Date/Time   NA 162* 02/23/2013 0500   K 3.1* 02/23/2013 0500   CL 129* 02/23/2013 0500   CO2 25 02/23/2013 0500   GLUCOSE 112* 02/23/2013 0500   BUN 30* 02/23/2013 0500   CREATININE 0.78 02/23/2013 0500   CALCIUM 9.4 02/23/2013 0500   PROT 7.5 01/16/2008 1530   ALBUMIN 4.5 01/16/2008 1530   AST 34 01/16/2008 1530   ALT 26 01/16/2008 1530   ALKPHOS 60 01/16/2008 1530   BILITOT 0.9 01/16/2008 1530   GFRNONAA 77* 02/23/2013 0500   GFRAA 89* 02/23/2013 0500      Time In Time Out Total Time Spent with Patient Total Overall Time  0810 0930 75 min 80 min    Greater than 50%  of this time was spent counseling and coordinating care related to the above assessment and plan.  Discussed with Dr Rondell Reams  Lorinda Creed NP  Palliative Medicine Team Team Phone # [phone] Pager [phone]

## 2013-02-23 NOTE — Evaluation (Signed)
Physical Therapy Evaluation Patient Details Name: Caroline Goodman MRN: [id number] DOB: 07-28-1930 Today's Date: 02/23/2013 Time: 4098-1191 PT Time Calculation (min): 16 min  PT Assessment / Plan / Recommendation Clinical Impression  Pt is an 79 year old female admitted for acute encephalopathy.  Pt from memory care unit ALF and usually very ambulatory.  Pt able to participate with multimodal cues however upon standing abruptly brought upper body back so lowered safely to bed, therefore did not ambulate pt today.  Pt would benefit from acute PT services to safely mobilize pt during hospital stay to improve functional independence in preparation for d/c to next venue.  Pt requiring assist for mobility and high risk for falls so would need 24/7 assist upon d/c.  If ALF unable to provide, recommend SNF.    PT Assessment  Patient needs continued PT services    Follow Up Recommendations  Supervision/Assistance - 24 hour;SNF    Does the patient have the potential to tolerate intense rehabilitation      Barriers to Discharge        Equipment Recommendations  None recommended by PT    Recommendations for Other Services     Frequency Min 3X/week    Precautions / Restrictions Precautions Precautions: Fall Restrictions Weight Bearing Restrictions: No   Pertinent Vitals/Pain N/a  No signs of distress      Mobility  Bed Mobility Bed Mobility: Supine to Sit;Sit to Supine Supine to Sit: 1: +2 Total assist Supine to Sit: Patient Percentage: 30% Sit to Supine: 1: +2 Total assist Sit to Supine: Patient Percentage: 10% Details for Bed Mobility Assistance: increased assist possibly due to decreased cognition, multimodal cues given for technique Transfers Transfers: Sit to Stand;Stand to Sit Sit to Stand: 1: +2 Total assist;From bed Sit to Stand: Patient Percentage: 40% Stand to Sit: 1: +2 Total assist;To bed Stand to Sit: Patient Percentage: 30% Details for Transfer Assistance: pt given  2 HHA for transfers, upon standing pt walked feet forward and brought upper body back abruptly so safely lowered trunk onto bed and kept LEs from sliding, pt assisted back to sitting and then performed sit to stand again, pt able to take a few steps up [facility] Ambulation/Gait Ambulation/Gait Assistance: Not tested (comment)    Exercises     PT Diagnosis: Difficulty walking  PT Problem List: Decreased mobility;Decreased cognition;Decreased safety awareness PT Treatment Interventions: DME instruction;Gait training;Patient/family education;Functional mobility training;Therapeutic activities;Therapeutic exercise;Balance training   PT Goals Acute Rehab PT Goals PT Goal Formulation: With patient/family Time For Goal Achievement: 03/09/13 Potential to Achieve Goals: Good Pt will go Supine/Side to Sit: with supervision PT Goal: Supine/Side to Sit - Progress: Goal set today Pt will go Sit to Supine/Side: with supervision PT Goal: Sit to Supine/Side - Progress: Goal set today Pt will go Sit to Stand: with supervision PT Goal: Sit to Stand - Progress: Goal set today Pt will go Stand to Sit: with supervision PT Goal: Stand to Sit - Progress: Goal set today Pt will Ambulate: 51 - 150 feet;with supervision;with least restrictive assistive device PT Goal: Ambulate - Progress: Goal set today  Visit Information  Last PT Received On: 02/23/13 Assistance Needed: +2    Subjective Data  Subjective: smiles and laughs, verbal but nonsensical Patient Stated Goal: unable to state   Prior Functioning  Home Living Type of Home: Other (Comment) (memory care unit) Prior Function Comments: Per son, pt ambulatory without assistive device around memory care unit, daughter reports no known falls Communication Communication:  Expressive difficulties    Cognition  Cognition Arousal/Alertness: Awake/alert Overall Cognitive Status: History of cognitive impairments - at baseline    Extremity/Trunk Assessment  Right Upper Extremity Assessment RUE ROM/Strength/Tone: [facility] for tasks assessed Left Upper Extremity Assessment LUE ROM/Strength/Tone: WFL for tasks assessed Right Lower Extremity Assessment RLE ROM/Strength/Tone: [facility] for tasks assessed Left Lower Extremity Assessment LLE ROM/Strength/Tone: WFL for tasks assessed   Balance    End of Session PT - End of Session Activity Tolerance: Patient tolerated treatment well Patient left: in bed;with call bell/phone within reach;with bed alarm set;with family/visitor present Nurse Communication: Mobility status  GP     Alohilani Levenhagen,KATHrine E 02/23/2013, 12:41 PM Zenovia Jarred, PT, DPT 02/23/2013 Pager: [phone]

## 2013-02-23 NOTE — Progress Notes (Signed)
INITIAL NUTRITION ASSESSMENT  DOCUMENTATION CODES Per approved criteria  -Not Applicable   INTERVENTION: Provide Ensure Complete po BID, each supplement provides 350 kcal and 13 grams of protein. Provide Multivitamin with minerals daily  NUTRITION DIAGNOSIS: Inadequate oral intake related to decreased appetite as evidenced by steady weight loss over the past few years and 8% wt loss in the past year.   Goal: Pt to meet >/= 90% of their estimated nutrition needs  Monitor:  PO intake Weight Labs  Reason for Assessment: Consult due to Poor PO intake  79 y.o. female  Admitting Dx: UTI, acute renal failure, acute metabolic encephalopathy  ASSESSMENT: 79 y.o. female with history of hypertension, CAD, afib, dementia in memory care unit , was brought to the hospital for decreased responsiveness, and decreased po intake. On arrival to ED, she was found obtunded, hypernatremic, acute renal failure, UTI.  Pt asleep and unresponsive at time of visit. Pt's daughter present in the room was able to provide history. Per daughter pt has not been eating for the past few days but, was eating normally before with 3 good meals daily, a variety of foods, and a normal appetite. Per daughter pt weighed 135 lbs 2 years ago when she moved to the ALF but, recently has been maintaining weight around 112 lbs. Per daughter pt has no problems chewing or swallowing and eats all regular foods with thin liquids. Daughter thinks that recent poor po intake is related to dehydration and weight loss is due to decrease in appetite since moving to ALF. Per nursing notes, pt ate 75% of dinner last night and 25% of breakfast this morning. SLP recommends Dysphagia 1 with thin liquids.   Height: Ht Readings from Last 1 Encounters:  02/22/13 5' (1.524 m)    Weight: Wt Readings from Last 1 Encounters:  02/22/13 103 lb 9.9 oz (47 kg)    Ideal Body Weight: 100 lbs  % Ideal Body Weight: 103%  Wt Readings from Last 10  Encounters:  02/22/13 103 lb 9.9 oz (47 kg)  06/11/10 126 lb (57.153 kg)  06/29/09 128 lb (58.06 kg)    Usual Body Weight: 112 lbs per pt's daughter  % Usual Body Weight: 92%  BMI:  Body mass index is 20.24 kg/(m^2).  Estimated Nutritional Needs: Kcal: 1410-1550 Protein: 47-56 grams Fluid: > 1.4 L  Skin: +1 RLE and LLE edema  Diet Order: Dysphagia  EDUCATION NEEDS: -No education needs identified at this time   Intake/Output Summary (Last 24 hours) at 02/23/13 1107 Last data filed at 02/23/13 0855  Gross per 24 hour  Intake 1021.25 ml  Output    400 ml  Net 621.25 ml    Last BM: 5/20  Labs:   Recent Labs Lab 02/22/13 1148 02/22/13 1645 02/22/13 1959 02/23/13 0500  NA 169*  --  165* 162*  K 3.6  --  3.1* 3.1*  CL >130*  --  >130* 129*  CO2 25  --  24 25  BUN 48*  --  37* 30*  CREATININE 1.22* 1.04 0.90 0.78  CALCIUM 10.1  --  9.1 9.4  MG  --  2.4  --   --   PHOS  --  3.0  --   --   GLUCOSE 121*  --  125* 112*    CBG (last 3)   Recent Labs  02/23/13 0744  GLUCAP 113*    Scheduled Meds: . aspirin  81 mg Oral Daily  . cefTRIAXone (ROCEPHIN)  IV  1 g Intravenous Q24H  . diltiazem  360 mg Oral Daily  . donepezil  10 mg Oral QHS  . enoxaparin (LOVENOX) injection  30 mg Subcutaneous Q24H  . memantine  5 mg Oral BID  . rosuvastatin  2.5 mg Oral q1800  . sodium chloride  3 mL Intravenous Q12H    Continuous Infusions: . dextrose 100 mL/hr at 02/23/13 1914    Past Medical History  Diagnosis Date  . Dementia   . Anxiety   . Coronary artery disease   . Stroke   . A-fib     Past Surgical History  Procedure Laterality Date  . Coronary artery bypass graft  2002    Ian Malkin RD, LDN Inpatient Clinical Dietitian Pager: [phone] After Hours Pager: [phone]

## 2013-02-23 NOTE — Progress Notes (Signed)
CRITICAL VALUE ALERT  Critical value received:  Na 162  Date of notification:  02/23/2013  Time of notification:  0622  Critical value read back:yes  Nurse who received alert:  L. Vergel de Lucy Chris RN  MD notified (1st page):  Donnamarie Poag NP  Time of first page:  0625  MD notified (2nd page):  Time of second page:  Responding MD:  Donnamarie Poag NP  Time MD responded:  0630

## 2013-02-23 NOTE — Care Management Note (Addendum)
    Page 1 of 1   02/25/2013     3:10:06 PM   CARE MANAGEMENT NOTE 02/25/2013  Patient:  TIFFANYANN, DEROO   Account Number:  [id number]  Date Initiated:  02/23/2013  Documentation initiated by:  Lanier Clam  Subjective/Objective Assessment:   ADMITTED W/ACUTE ENCEPHALOPATHY.ZO:[id number].     Action/Plan:   FROM ALF- [facility]-MEMORY CARE.   Anticipated DC Date:  02/25/2013   Anticipated DC Plan:  SKILLED NURSING FACILITY      DC Planning Services  CM consult      Choice offered to / List presented to:             Status of service:  Completed, signed off Medicare Important Message given?   (If response is "NO", the following Medicare IM given date fields will be blank) Date Medicare IM given:   Date Additional Medicare IM given:    Discharge Disposition:  SKILLED NURSING FACILITY  Per UR Regulation:  Reviewed for med. necessity/level of care/duration of stay  If discussed at Long Length of Stay Meetings, dates discussed:    Comments:  02/25/13 Floride Hutmacher RN,BSN NCM [phone] D/C SNF W/PCS-[facility].  02/23/13 Jsiah Menta RN,BSN NCM [phone] PMT CONS-PCS SERVICE @ FACILITY.PT/OT CONS.

## 2013-02-23 NOTE — Progress Notes (Signed)
CSW spoke with patient's son regarding physical therapy evaluation and possible need for snf if patient doesn't improve physically during hospital stay. He is agreeable to CSW faxing patient out and receiving bed offers, preferably in the [place] area.he states that physically this is a huge status change for patient as prior to hospital stay, patient essentially had the run of [facility] and walked everywhere. CSW to follow for possible snf placement.  Lannah Koike C. Ayona Yniguez MSW, LCSW [phone]

## 2013-02-23 NOTE — Evaluation (Signed)
Occupational Therapy Evaluation Patient Details Name: Caroline Goodman MRN: [id number] DOB: [birth year] Today's Date: 02/23/2013 Time: 4401-0272 OT Time Calculation (min): 14 min  OT Assessment / Plan / Recommendation Clinical Impression  Pt presents to OT with decreased I with all ADL activity and will benefit from skilled OT to increase I with ADL activity andreturn to memory care unit at ALF    OT Assessment  Patient needs continued OT Services    Follow Up Recommendations  SNF;Supervision/Assistance - 24 hour;Other (comment) (or OT at ALF if they can provide care)             Frequency  Min 2X/week    Precautions / Restrictions Restrictions Weight Bearing Restrictions: No       ADL  Transfers/Ambulation Related to ADLs: Co eval with PT.  Pt did follow several 1 step commands, but inconsistently.  Pt 2 person A for all mobility.  Pt did walk legs out with PT and OT and sit on bed quickly.  Pt is a fall risk    OT Diagnosis: Generalized weakness;Cognitive deficits  OT Treatment Interventions: Self-care/ADL training;Patient/family education   OT Goals Acute Rehab OT Goals OT Goal Formulation: With patient/family Time For Goal Achievement: 03/09/13 Potential to Achieve Goals: Fair ADL Goals Pt Will Perform Eating: with min assist;Unsupported;Sitting, edge of bed ADL Goal: Eating - Progress: Goal set today Pt Will Perform Grooming: Standing at sink;with min assist ADL Goal: Grooming - Progress: Goal set today Pt Will Transfer to Toilet: Comfort height toilet ADL Goal: Toilet Transfer - Progress: Goal set today  Visit Information  Last OT Received On: 02/23/13 Assistance Needed: +2    Subjective Data  Subjective: Pt hard to understand.  Family reports pt does not sopeak much   Prior Functioning     Home Living Type of Home: Other (Comment) (memory care unit) Communication Communication: Expressive difficulties         Vision/Perception Vision -  History Patient Visual Report: No change from baseline   Cognition  Cognition Overall Cognitive Status: History of cognitive impairments - at baseline    Extremity/Trunk Assessment Right Upper Extremity Assessment RUE ROM/Strength/Tone: [facility] for tasks assessed Left Upper Extremity Assessment LUE ROM/Strength/Tone: WFL for tasks assessed     Mobility Bed Mobility Bed Mobility: Supine to Sit Supine to Sit: 1: +2 Total assist Supine to Sit: Patient Percentage: 30% Transfers Transfers: Sit to Stand;Stand to Sit Sit to Stand: 1: +2 Total assist;From bed Stand to Sit: 1: +2 Total assist;To bed Stand to Sit: Patient Percentage: 30%           End of Session OT - End of Session Patient left: in bed;with family/visitor present  GO     Sharena Dibenedetto, [place]ung 02/23/2013, 11:11 AM

## 2013-02-24 DIAGNOSIS — E876 Hypokalemia: Secondary | ICD-10-CM | POA: Diagnosis not present

## 2013-02-24 DIAGNOSIS — G9340 Encephalopathy, unspecified: Secondary | ICD-10-CM | POA: Diagnosis present

## 2013-02-24 LAB — BASIC METABOLIC PANEL
BUN: 18 mg/dL (ref 6–23)
Chloride: 119 mEq/L — ABNORMAL HIGH (ref 96–112)
GFR calc Af Amer: >90 mL/min (ref >90)
Glucose, Bld: 101 mg/dL — ABNORMAL HIGH (ref 70–99)
Potassium: 3.6 mEq/L (ref 3.5–5.1)
Sodium: 150 mEq/L — ABNORMAL HIGH (ref 135–145)

## 2013-02-24 NOTE — Progress Notes (Signed)
Patient cleared for discharge. Notified [facility], they will come assess this afternoon. Anhad Sheeley C. Maher Shon MSW, LCSW [phone]

## 2013-02-24 NOTE — Progress Notes (Signed)
Speech Language Pathology Dysphagia Treatment Patient Details Name: Caroline Goodman MRN: [id number] DOB: 1930/07/26 Today's Date: 02/24/2013 Time: 1545-1610 SLP Time Calculation (min): 25 min  Assessment / Plan / Recommendation Clinical Impression  Pt seen today for skilled dysphagia treatment, pt was alone in her room without family present.  SLP provided oral care and placed pt's dentures for her.  She was fully alert today and was self-feeding today icecream independently and graham cracker/water with assist.  Overt strong cough x1 with cracker, suspect premature spillage into pharynx/? trachea.  Cough x1 noted with water via straw and small single boluses tolerated better without overt coughing.  Nurse tech reports pt ate well today with mild coughing with liquids.  SLP educated nurse tech to precautions to mitigate pt's aspiration risk.    Rec continued puree/thin diet for institutionalized feeding but allow family to bring soft solids when visit pt for comfort.      Diet Recommendation  Continue with Current Diet: Dysphagia 1 (puree);Thin liquid    SLP Plan Continue with current plan of care   Pertinent Vitals/Pain Afebrile, decreased   Swallowing Goals  SLP Swallowing Goals Swallow Study Goal #2 - Progress: Progressing toward goal  General Temperature Spikes Noted: No Respiratory Status: Room air Behavior/Cognition: Alert;Decreased sustained attention;Doesn't follow directions Oral Cavity - Dentition: Dentures, top (partial lower) Patient Positioning: Upright in bed  Oral Cavity - Oral Hygiene   slp brushed pt's gums, tongue and placed dentures  Dysphagia Treatment Treatment focused on: Skilled observation of diet tolerance Treatment Methods/Modalities: Skilled observation Patient observed directly with PO's: Yes Feeding: Able to feed self (with assist, fed self icecream, A to hold cup) Liquids provided via: Straw Oral Phase Signs & Symptoms: Prolonged bolus  formation;Prolonged oral phase;Prolonged mastication Pharyngeal Phase Signs & Symptoms: Suspected delayed swallow initiation;Delayed cough Type of cueing: Verbal Amount of cueing: Maximal   GO     Donavan Burnet, MS [facility] SLP [phone]

## 2013-02-24 NOTE — Progress Notes (Addendum)
[facility] PROGRESS NOTE  Caroline Goodman ZOX:[id number] DOB: [birth year] DOA: 02/22/2013 PCP: No primary provider on file.  Brief narrative 79 year old female, resident of memory care unit/ALF, PMH of advanced dementia, CAD, stroke, A. fib was admitted on 02/22/13 with one week history of progressively worsening mental status changes (less responsive) and reduced by mouth intake. In the ED, obtunded,sodium 169, chloride >130, creatinine 1.22, WBC 13.6, CT head without acute findings and positive UA. Hospitalist admission was requested.    Assessment/Plan: 1. Acute metabolic encephalopathy: Likely secondary to hypernatremia and acute renal failure complicating underlying advanced dementia. Mental status probably is return to baseline. 2. Hypernatremic dehydration: Likely precipitated by UTI and poor oral intake. Improving. Reduce IV fluids and follow BMP in a.m. 3. Acute renal failure: Secondary to dehydration. No hydronephrosis on renal ultrasound. Resolved. 4. UTI: Debris in the bladder on renal ultrasound. Urine culture negative-DC IV Rocephin 5. Hypokalemia: Repleted 6. Chronic A. fib: Controlled ventricular rate. Not on anticoagulation-possibly not a candidate secondary to fall risk from advanced dementia. Continue Cardizem and low dose aspirin. Heart rate is mostly controlled but occasionally has bradycardia in the 40s and asymptomatic. If persistent severe bradycardia, may consider reducing Cardizem dose. 7. Dementia: Continue Aricept and Namenda. Mental status probably at baseline. 8. Mild leukocytosis. 9. Hypertension: Fluctuating. Monitor. Continue Cardizem.  Code Status: DO NOT RESUSCITATE Family Communication: Discussed with patient's son Mr. Alveena Taira and daughter Ms. Lynn at bedside on 5/21. Disposition Plan: Remains in patient-possible discharge on 5/23. Family prefers that she returns to the memory care unit if possible. When discharging please DC with palliative care  followup at facility.   Consultants:  PT and OT  Speech therapy  Palliative care team  Procedures:  None  Antibiotics:  IV Rocephin 5/20 > 5/22  HPI/Subjective: Patient is pleasantly confused. Per nursing, no acute events.   Objective: Filed Vitals:   02/23/13 1317 02/23/13 1403 02/23/13 2208 02/24/13 0449  BP: 160/94 137/53 128/72 159/57  Pulse:   65 56  Temp:   97.9 F (36.6 C) 97.9 F (36.6 C)  TempSrc:   Axillary Axillary  Resp:   22 22  Height:      Weight:      SpO2:   100% 98%    Intake/Output Summary (Last 24 hours) at 02/24/13 1321 Last data filed at 02/24/13 1232  Gross per 24 hour  Intake 1747.5 ml  Output      0 ml  Net 1747.5 ml   Filed Weights   02/22/13 1519 02/22/13 1605  Weight: 47 kg (103 lb 9.9 oz) 47 kg (103 lb 9.9 oz)    Exam:   General exam: Pleasant and comfortable.  Respiratory system: Clear. No increased work of breathing.  Cardiovascular system: S1 & S2 heard, irregularly irregular. No JVD, murmurs, gallops, clicks or pedal edema. Telemetry: Sinus rhythm-sinus bradycardia in the 50s, occasional sinus bradycardia in the 40s.  Gastrointestinal system: Abdomen is nondistended, soft and nontender. Normal bowel sounds heard.  Central nervous system: Alert and oriented only to self. No focal neurological deficits.  Extremities: Symmetric 5 x 5 power.   Data Reviewed: Basic Metabolic Panel:  Recent Labs Lab 02/22/13 1148 02/22/13 1645 02/22/13 1959 02/23/13 0500 02/24/13 0505  NA 169*  --  165* 162* 150*  K 3.6  --  3.1* 3.1* 3.6  CL >130*  --  >130* 129* 119*  CO2 25  --  24 25 21   GLUCOSE 121*  --  125* 112* 101*  BUN 48*  --  37* 30* 18  CREATININE 1.22* 1.04 0.90 0.78 0.62  CALCIUM 10.1  --  9.1 9.4 9.0  MG  --  2.4  --   --   --   PHOS  --  3.0  --   --   --    Liver Function Tests: No results found for this basename: AST, ALT, ALKPHOS, BILITOT, PROT, ALBUMIN,  in the last 168 hours No results found for  this basename: LIPASE, AMYLASE,  in the last 168 hours No results found for this basename: AMMONIA,  in the last 168 hours CBC:  Recent Labs Lab 02/22/13 1148 02/22/13 1645 02/23/13 0500  WBC 13.6* 12.7* 11.4*  NEUTROABS 10.0*  --   --   HGB 14.6 13.6 13.5  HCT 45.4 43.2 42.7  MCV 96.8 97.7 97.9  PLT 222 213 181   Cardiac Enzymes:  Recent Labs Lab 02/22/13 1148  TROPONINI <0.30   BNP (last 3 results) No results found for this basename: PROBNP,  in the last 8760 hours CBG:  Recent Labs Lab 02/23/13 0744 02/24/13 0742  GLUCAP 113* 89    Recent Results (from the past 240 hour(s))  URINE CULTURE     Status: None   Collection Time    02/22/13  1:30 PM      Result Value Range Status   Specimen Description URINE, CLEAN CATCH   Final   Special Requests NONE   Final   Culture  Setup Time 02/22/2013 21:21   Final   Colony Count NO GROWTH   Final   Culture NO GROWTH   Final   Report Status 02/23/2013 FINAL   Final     Studies: US Renal  02/22/2013   *RADIOLOGY REPORT*  Clinical Data: Acute renal failure.  RENAL/URINARY TRACT ULTRASOUND COMPLETE  Comparison:  CTA abdomen 02/22/2007.  Findings:  Right Kidney:  No hydronephrosis.  Well-preserved cortex.  No shadowing calculi.  Normal size and parenchymal echotexture without focal abnormalities.  Approximately 12.0 cm in length.  Left Kidney:  Less than optimal imaging of the left kidney due to poor acoustic window. No hydronephrosis.  Well-preserved cortex. No shadowing calculi.  Normal size and parenchymal echotexture without focal abnormalities.  Approximately 9.4 cm in length.  Bladder:  Ureteral jets not visualized.  Layering debris in the dependent portion of the bladder.  IMPRESSION:  1.  Normal-appearing kidneys without evidence of hydronephrosis to suggest urinary tract obstruction. 2.  Layering debris in the urinary bladder which could be due to infection or blood.   Original Report Authenticated By: Hulan Saas, M.D.    Portable Chest 1 View  02/22/2013   *RADIOLOGY REPORT*  Clinical Data: Leukocytosis.  Evaluate for possible pneumonia.  PORTABLE CHEST - 1 VIEW  Comparison: 11/07/2011  Findings: There are changes from CABG surgery.  The cardiac silhouette is mildly enlarged.  No mediastinal or hilar masses. The lungs show mild interstitial thickening, chronic.  The lungs are also mildly hyperexpanded.  There is no infiltrate or edema. No pleural effusion or pneumothorax.  IMPRESSION: No acute cardiopulmonary disease.   Original Report Authenticated By: Amie Portland, M.D.     Additional labs:   Scheduled Meds: . aspirin  81 mg Oral Daily  . cefTRIAXone (ROCEPHIN)  IV  1 g Intravenous Q24H  . diltiazem  360 mg Oral Daily  . donepezil  10 mg Oral QHS  . enoxaparin (LOVENOX) injection  40 mg Subcutaneous Q24H  . feeding supplement  237  mL Oral BID BM  . memantine  5 mg Oral BID  . multivitamin with minerals  1 tablet Oral Daily  . rosuvastatin  2.5 mg Oral q1800  . sodium chloride  3 mL Intravenous Q12H   Continuous Infusions: . dextrose 50 mL/hr at 02/24/13 1129    Active Problems:   HYPERTENSION   ATRIAL FIBRILLATION, HX OF   Encephalopathy acute   Leukocytosis, unspecified   UTI (lower urinary tract infection)   Acute hypernatremia   Acute renal failure   Dementia   Weakness generalized    Time spent: 20 minutes.    [facility]  [facility] Pager [phone].   If 8PM-8AM, please contact night-coverage at [email or web address], password [facility] 02/24/2013, 1:21 PM  LOS: 2 days

## 2013-02-24 NOTE — Progress Notes (Signed)
Physical Therapy Treatment Patient Details Name: Caroline Goodman MRN: [id number] DOB: [birth year] Today's Date: 02/24/2013 Time: 4098-1191 PT Time Calculation (min): 21 min  PT Assessment / Plan / Recommendation Comments on Treatment Session  Pt ambulated in hallway with 2 hand hold assist and min assist provided for turning.  Pt follows visual and manual cues better then verbal due to impaired cognition.  Pt with hx of dementia and stated "I don't feel good" near end of ambulation so assisted back to supine for safety as pt unable to provide further details.  Pt will need assist and supervision upon d/c so if ALF unable to provide, recommend SNF.    Follow Up Recommendations  SNF;Supervision/Assistance - 24 hour     Does the patient have the potential to tolerate intense rehabilitation     Barriers to Discharge        Equipment Recommendations  None recommended by PT    Recommendations for Other Services    Frequency     Plan Discharge plan remains appropriate;Frequency remains appropriate    Precautions / Restrictions Precautions Precautions: Fall   Pertinent Vitals/Pain Pt reported not feeling well during ambulation so assisted back to bed, unable to rate due to cognition Pt stated upon returning to supine, "You're a doll."    Mobility  Bed Mobility Bed Mobility: Supine to Sit;Sit to Supine Supine to Sit: 1: +2 Total assist Supine to Sit: Patient Percentage: 30% Sit to Supine: 1: +2 Total assist Sit to Supine: Patient Percentage: 10% Details for Bed Mobility Assistance: continues to require increased assist for bed mobility even given multimodal cues however pt gave very little assist even once therapist helped initiate Transfers Transfers: Sit to Stand;Stand to Sit Sit to Stand: 1: +2 Total assist;From bed Sit to Stand: Patient Percentage: 60% Stand to Sit: 1: +2 Total assist;To bed Stand to Sit: Patient Percentage: 70% Details for Transfer Assistance: +2 for safety,  assist to initiate  Ambulation/Gait Ambulation/Gait Assistance: 4: Min assist Ambulation Distance (Feet): 240 Feet Assistive device: 2 person hand held assist Ambulation/Gait Assistance Details: due to dementia used 2 HHA, pt mostly required guidance however min assist provided to steady during turning Gait Pattern: Step-through pattern;Narrow base of support;Shuffle Gait velocity: decreased    Exercises     PT Diagnosis:    PT Problem List:   PT Treatment Interventions:     PT Goals Acute Rehab PT Goals PT Goal: Supine/Side to Sit - Progress: Progressing toward goal PT Goal: Sit to Supine/Side - Progress: Progressing toward goal PT Goal: Sit to Stand - Progress: Progressing toward goal PT Goal: Stand to Sit - Progress: Progressing toward goal PT Goal: Ambulate - Progress: Progressing toward goal  Visit Information  Last PT Received On: 02/24/13 Assistance Needed: +2    Subjective Data  Subjective: smiles and laughs, verbal but nonsensical   Cognition  Cognition Arousal/Alertness: Awake/alert Overall Cognitive Status: History of cognitive impairments - at baseline    Balance     End of Session PT - End of Session Equipment Utilized During Treatment: Gait belt Activity Tolerance: Patient tolerated treatment well Patient left: in bed;with call bell/phone within reach;with bed alarm set   GP     Amoni Morales,KATHrine E 02/24/2013, 1:58 PM Zenovia Jarred, PT, DPT 02/24/2013 Pager: [phone]

## 2013-02-24 NOTE — Progress Notes (Signed)
Progress Note from the Palliative Medicine Team at [facility]  Subjective: patient is pleasantly confused, accepting breakfast and appears comfortable  -spoke to [place] her son at bedside.  He is hopeful for return to [facility] with palliative Care Services to follow.  He tells me that the facially assures  him the level of care his mother needs to include PT     Objective: No Known Allergies Scheduled Meds: . aspirin  81 mg Oral Daily  . cefTRIAXone (ROCEPHIN)  IV  1 g Intravenous Q24H  . diltiazem  360 mg Oral Daily  . donepezil  10 mg Oral QHS  . enoxaparin (LOVENOX) injection  40 mg Subcutaneous Q24H  . feeding supplement  237 mL Oral BID BM  . memantine  5 mg Oral BID  . multivitamin with minerals  1 tablet Oral Daily  . rosuvastatin  2.5 mg Oral q1800  . sodium chloride  3 mL Intravenous Q12H   Continuous Infusions: . dextrose 100 mL/hr at 02/24/13 0412   PRN Meds:.acetaminophen, acetaminophen, hydrALAZINE, ondansetron (ZOFRAN) IV, ondansetron  BP 159/57  Pulse 56  Temp(Src) 97.9 F (36.6 C) (Axillary)  Resp 22  Ht 5' (1.524 m)  Wt 47 kg (103 lb 9.9 oz)  BMI 20.24 kg/m2  SpO2 98%   PPS:30 %  Pain Score:denies   Intake/Output Summary (Last 24 hours) at 02/24/13 1054 Last data filed at 02/24/13 0853  Gross per 24 hour  Intake 1627.5 ml  Output      0 ml  Net 1627.5 ml       Physical Exam:  General: frail, elderly female, NAD  HEENT: moist mucous membranes, no exudate  Chest: Diminished in bases, CTA  CVS: RRR  Abdomen: soft NT +BS  Ext: without edema  Neuro:pleasantly confused, unable to follow simple commands  Labs: CBC    Component Value Date/Time   WBC 11.4* 02/23/2013 0500   RBC 4.36 02/23/2013 0500   HGB 13.5 02/23/2013 0500   HCT 42.7 02/23/2013 0500   PLT 181 02/23/2013 0500   MCV 97.9 02/23/2013 0500   MCH 31.0 02/23/2013 0500   MCHC 31.6 02/23/2013 0500   RDW 14.3 02/23/2013 0500   LYMPHSABS 2.5 02/22/2013 1148   MONOABS 0.9 02/22/2013  1148   EOSABS 0.1 02/22/2013 1148   BASOSABS 0.0 02/22/2013 1148    BMET    Component Value Date/Time   NA 150* 02/24/2013 0505   K 3.6 02/24/2013 0505   CL 119* 02/24/2013 0505   CO2 21 02/24/2013 0505   GLUCOSE 101* 02/24/2013 0505   BUN 18 02/24/2013 0505   CREATININE 0.62 02/24/2013 0505   CALCIUM 9.0 02/24/2013 0505   GFRNONAA 83* 02/24/2013 0505   GFRAA >90 02/24/2013 0505    CMP     Component Value Date/Time   NA 150* 02/24/2013 0505   K 3.6 02/24/2013 0505   CL 119* 02/24/2013 0505   CO2 21 02/24/2013 0505   GLUCOSE 101* 02/24/2013 0505   BUN 18 02/24/2013 0505   CREATININE 0.62 02/24/2013 0505   CALCIUM 9.0 02/24/2013 0505   PROT 7.5 01/16/2008 1530   ALBUMIN 4.5 01/16/2008 1530   AST 34 01/16/2008 1530   ALT 26 01/16/2008 1530   ALKPHOS 60 01/16/2008 1530   BILITOT 0.9 01/16/2008 1530   GFRNONAA 83* 02/24/2013 0505   GFRAA >90 02/24/2013 0505     Assessment and Plan: 1. Code Status: DNR/DNI- 2. Symptom Control: 1. Weakness: Continue to encourage patient to function at  baseline, work with PT, and family to be aware of natural progression of disease and make decisions when change occurs  2. Dysphagia:  Continued education regarding th natural trajectory of dementia as it relates to swallow and nutritional intake.  Family  Encouraged to make decions dependant on patients progess/decline, considering quality of life and her advanced directives   3. Psycho/Social: Emotional support offered, 4. Disposition:  Hopeful for return to [facility] with PCS to follow.  Strongly encouraged to complete MOST form.     Time In Time Out Total Time Spent with Patient Total Overall Time  0900 0925 25 min 25 min    Greater than 50%  of this time was spent counseling and coordinating care related to the above assessment and plan.  Lorinda Creed NP  Palliative Medicine Team Team Phone # [phone] Pager [phone]  Discussed with Dr Rondell Reams 1

## 2013-02-25 LAB — BASIC METABOLIC PANEL
BUN: 15 mg/dL (ref 6–23)
Chloride: 113 mEq/L — ABNORMAL HIGH (ref 96–112)
GFR calc Af Amer: >90 mL/min (ref >90)
Glucose, Bld: 109 mg/dL — ABNORMAL HIGH (ref 70–99)
Potassium: 4 mEq/L (ref 3.5–5.1)
Sodium: 146 mEq/L — ABNORMAL HIGH (ref 135–145)

## 2013-02-25 MED ORDER — DILTIAZEM HCL ER COATED BEADS 300 MG PO CP24: 360.0000 mg | ORAL_CAPSULE | Freq: Every day | ORAL | Status: DC

## 2013-02-25 MED ORDER — ALPRAZOLAM 0.25 MG PO TABS: 0.2500 mg | ORAL_TABLET | Freq: Two times a day (BID) | ORAL | Status: DC | PRN

## 2013-02-25 NOTE — Progress Notes (Signed)
Occupational Therapy Treatment Patient Details Name: Caroline Goodman MRN: [id number] DOB: 03-31-1930 Today's Date: 02/25/2013 Time: 4098-1191 OT Time Calculation (min): 26 min  OT Assessment / Plan / Recommendation Comments on Treatment Session Spoke with son and plan is for SNF. Attempted to help pt up to bathroom but only able to stand briefly with nursing tech assist to wash periareas as pt was incontient of urine and bowel. Pt resisting movememt today when therapist tried to guide through bed mobility.     Follow Up Recommendations  SNF    Barriers to Discharge       Equipment Recommendations  3 in 1 bedside comode    Recommendations for Other Services    Frequency Min 2X/week   Plan Discharge plan remains appropriate    Precautions / Restrictions Precautions Precautions: Fall Restrictions Weight Bearing Restrictions: No        ADL  Toilet Transfer: Simulated;+2 Total assistance Toilet Transfer: Patient Percentage: 10% (see below) Toilet Transfer Method: Sit to stand ADL Comments: Sons present and want pt to do more activity as able. Attempted to get pt up to bathroom to perform grooming but pt resisting movement today. Attempted to help straighten her legs out in the bed and pt groaning and appeared to have some discomfort. Pt was lying on L side with knees hips and knees flexed and feel pt may have gotten stiff from lying like that and was therefore resisting movement. Used pad to scoot around to EOB but pt not assisting much. Tried to stand as pt had urinated in the bed and needed sheets and gown changed. Nurse tech in to assist wtih wash up and bed change. Pt also resisting standing up from EOB. 2 person hand held assist and pt only  helping minimally. Pt diid not stand up fully, just enough to clear bottom off bed to wash.     OT Diagnosis:    OT Problem List:   OT Treatment Interventions:     OT Goals Acute Rehab OT Goals OT Goal Formulation: With  patient/family Time For Goal Achievement: 03/09/13 Potential to Achieve Goals: Fair ADL Goals Pt Will Perform Eating: with min assist;Unsupported;Sitting, edge of bed Pt Will Perform Grooming: Standing at sink;with min assist ADL Goal: Grooming - Progress: Not progressing Pt Will Transfer to Toilet: Comfort height toilet ADL Goal: Toilet Transfer - Progress: Not progressing  Visit Information  Last OT Received On: 02/25/13 Assistance Needed: +2    Subjective Data  Subjective: pt making eye contact with therapist but doesnt say much Patient Stated Goal: none stated. family wants her to get up as able   Prior Functioning       Cognition  Cognition Overall Cognitive Status: Difficult to assess    Mobility  Bed Mobility Bed Mobility: Left Sidelying to Sit;Sitting - Scoot to Edge of Bed Left Sidelying to Sit: 1: +2 Total assist;HOB elevated Left Sidelying to Sit: Patient Percentage: 10% Sitting - Scoot to Edge of Bed: 1: +2 Total assist Sitting - Scoot to Edge of Bed: Patient Percentage: 0% Sit to Supine: 1: +2 Total assist;HOB flat Sit to Supine: Patient Percentage: 0% Details for Bed Mobility Assistance: use of bed pad to scoot around to EOB and to return to supine. Pt resisting all movement today. ? if still from position in bed. Let nursing tech know to change position frequently to avoid getting stiff.  Transfers Transfers: Sit to Stand;Stand to Sit Sit to Stand: 1: +2 Total assist;From bed;With upper extremity assist  Sit to Stand: Patient Percentage: 10% Stand to Sit: 1: +2 Total assist;To bed;With upper extremity assist Stand to Sit: Patient Percentage: 10% Details for Transfer Assistance: 2 person hand held assist    Exercises      Balance     End of Session OT - End of Session Activity Tolerance: Other (comment) (pt appeared to resist movement today. ?stiff and sore) Patient left: in bed;with bed alarm set  GO     Lennox Laity [phone] 02/25/2013, 10:07 AM

## 2013-02-25 NOTE — Progress Notes (Addendum)
CSW received call late yesterday afternoon that  place will be unable to accept patient back right away. Citing that patient needs snf. CSW this morning, provided son with bed offers. He chooses [facility]. He will meet at 1pm to complete paperwork.  Daemon Dowty C. Dijuan Sleeth MSW, LCSW [phone] Patient cleared for discharge. Packet copied and placed in [place]. ptar called for transportation. Patient's son aware and agreeable to transfer.  Jaymie Misch C. Terese Heier MSW, LCSW [phone]

## 2013-02-25 NOTE — Discharge Summary (Signed)
Physician Discharge Summary  Caroline Goodman MRN: [id number] DOB/AGE: 1930/06/05 79 y.o.  PCP: No primary provider on file.   Admit date: 02/22/2013 Discharge date: 02/25/2013  Discharge Diagnoses:  Hypernatremia   HYPERTENSION   ATRIAL FIBRILLATION, HX OF   Encephalopathy acute   Leukocytosis, unspecified   UTI (lower urinary tract infection)   Acute hypernatremia   Acute renal failure   Dementia   Weakness generalized   Acute encephalopathy   Hypokalemia     Medication List    TAKE these medications       ALPRAZolam 0.25 MG tablet  Commonly known as:  XANAX  Take 1 tablet (0.25 mg total) by mouth 2 (two) times daily as needed. For anxiety.     aspirin 81 MG chewable tablet  Chew 81 mg by mouth every morning.     cholecalciferol 1000 UNITS tablet  Commonly known as:  VITAMIN D  Take 1,000 Units by mouth daily.     diltiazem 300 MG 24 hr capsule  Commonly known as:  CARDIZEM CD  Take 1 capsule (300 mg total) by mouth daily.     docusate sodium 100 MG capsule  Commonly known as:  COLACE  Take 100 mg by mouth daily.     donepezil 10 MG tablet  Commonly known as:  ARICEPT  Take 10 mg by mouth every evening.     losartan 50 MG tablet  Commonly known as:  COZAAR  Take 50 mg by mouth daily.     MAPAP ARTHRITIS PAIN 650 MG CR tablet  Generic drug:  acetaminophen  Take 1,300 mg by mouth every morning.     memantine 5 MG tablet  Commonly known as:  NAMENDA  Take 5 mg by mouth 2 (two) times daily.     omeprazole 20 MG capsule  Commonly known as:  PRILOSEC  Take 20 mg by mouth every morning.     pravastatin 10 MG tablet  Commonly known as:  PRAVACHOL  Take 10 mg by mouth daily.     sennosides-docusate sodium 8.6-50 MG tablet  Commonly known as:  SENOKOT-S  Take 2 tablets by mouth 2 (two) times daily.     THERA-GESIC 1-15 % Crea  Apply 1 application topically at bedtime.     traMADol 50 MG tablet  Commonly known as:  ULTRAM  Take 50 mg by  mouth 4 (four) times daily as needed. For pain.        Discharge Condition: Guarded   Disposition: 01-Home or Self Care   Consults:  Palliative care  Significant Diagnostic Studies: Ct Head Wo Contrast  02/22/2013   *RADIOLOGY REPORT*  Clinical Data: Altered mental status  CT HEAD WITHOUT CONTRAST  Technique:  Contiguous axial images were obtained from the base of the skull through the vertex without contrast. Study was obtained within 24 hours of arrival at the emergency department.  Comparison: November 07, 2011  Findings:  There is moderate diffuse atrophy with ventricles somewhat enlarged in proportion the sulci.  There is marked enlargement of the temporal horns of the lateral ventricles, particularly on the right.  There is felt to be a degree of ex vacuo phenomenon involving the right temporal horn.  There is no evidence of mass, hemorrhage, extra-axial fluid collection, or midline shift.  There is evidence of a prior infarct in the anterior, medial aspect of the right occipital lobe, stable. There is small vessel disease throughout the centra semiovale bilaterally.  There are tiny lacunar  infarcts in the left lentiform nucleus, nonacute.  No acute appearing infarct is appreciable on this study.  Bony calvarium appears intact.  The mastoid air cells are clear.  IMPRESSION: Atrophy with questionable concomitant normal pressure hydrocephalus.  Prior infarct anteromedial right occipital lobe. Moderate supratentorial small vessel disease, stable.  Suspect a degree of ex vacuo phenomenon involving the right temporal horn of the lateral ventricle.  There is no demonstrable mass, hemorrhage, or acute appearing infarct.   Original Report Authenticated By: Bretta Bang, M.D.   US Renal  02/22/2013   *RADIOLOGY REPORT*  Clinical Data: Acute renal failure.  RENAL/URINARY TRACT ULTRASOUND COMPLETE  Comparison:  CTA abdomen 02/22/2007.  Findings:  Right Kidney:  No hydronephrosis.  Well-preserved  cortex.  No shadowing calculi.  Normal size and parenchymal echotexture without focal abnormalities.  Approximately 12.0 cm in length.  Left Kidney:  Less than optimal imaging of the left kidney due to poor acoustic window. No hydronephrosis.  Well-preserved cortex. No shadowing calculi.  Normal size and parenchymal echotexture without focal abnormalities.  Approximately 9.4 cm in length.  Bladder:  Ureteral jets not visualized.  Layering debris in the dependent portion of the bladder.  IMPRESSION:  1.  Normal-appearing kidneys without evidence of hydronephrosis to suggest urinary tract obstruction. 2.  Layering debris in the urinary bladder which could be due to infection or blood.   Original Report Authenticated By: Hulan Saas, M.D.   Portable Chest 1 View  02/22/2013   *RADIOLOGY REPORT*  Clinical Data: Leukocytosis.  Evaluate for possible pneumonia.  PORTABLE CHEST - 1 VIEW  Comparison: 11/07/2011  Findings: There are changes from CABG surgery.  The cardiac silhouette is mildly enlarged.  No mediastinal or hilar masses. The lungs show mild interstitial thickening, chronic.  The lungs are also mildly hyperexpanded.  There is no infiltrate or edema. No pleural effusion or pneumothorax.  IMPRESSION: No acute cardiopulmonary disease.   Original Report Authenticated By: Amie Portland, M.D.   **    Microbiology: Recent Results (from the past 240 hour(s))  URINE CULTURE     Status: None   Collection Time    02/22/13  1:30 PM      Result Value Range Status   Specimen Description URINE, CLEAN CATCH   Final   Special Requests NONE   Final   Culture  Setup Time 02/22/2013 21:21   Final   Colony Count NO GROWTH   Final   Culture NO GROWTH   Final   Report Status 02/23/2013 FINAL   Final     Labs: Results for orders placed during the hospital encounter of 02/22/13 (from the past 48 hour(s))  GLUCOSE, CAPILLARY     Status: Abnormal   Collection Time    02/23/13  7:44 AM      Result Value Range    Glucose-Capillary 113 (*) 70 - 99 mg/dL  BASIC METABOLIC PANEL     Status: Abnormal   Collection Time    02/24/13  5:05 AM      Result Value Range   Sodium 150 (*) 135 - 145 mEq/L   Comment: RESULT REPEATED AND VERIFIED     DELTA CHECK NOTED   Potassium 3.6  3.5 - 5.1 mEq/L   Chloride 119 (*) 96 - 112 mEq/L   Comment: RESULT REPEATED AND VERIFIED     DELTA CHECK NOTED   CO2 21  19 - 32 mEq/L   Glucose, Bld 101 (*) 70 - 99 mg/dL   BUN 18  6 - 23 mg/dL   Comment: RESULT REPEATED AND VERIFIED     DELTA CHECK NOTED   Creatinine, Ser 0.62  0.50 - 1.10 mg/dL   Calcium 9.0  8.4 - 16.1 mg/dL   GFR calc non Af Amer 83 (*) >90 mL/min   GFR calc Af Amer >90  >90 mL/min   Comment:            The eGFR has been calculated     using the CKD EPI equation.     This calculation has not been     validated in all clinical     situations.     eGFR's persistently     <90 mL/min signify     possible Chronic Kidney Disease.  GLUCOSE, CAPILLARY     Status: None   Collection Time    02/24/13  7:42 AM      Result Value Range   Glucose-Capillary 89  70 - 99 mg/dL  BASIC METABOLIC PANEL     Status: Abnormal   Collection Time    02/25/13  5:22 AM      Result Value Range   Sodium 146 (*) 135 - 145 mEq/L   Potassium 4.0  3.5 - 5.1 mEq/L   Chloride 113 (*) 96 - 112 mEq/L   CO2 27  19 - 32 mEq/L   Glucose, Bld 109 (*) 70 - 99 mg/dL   BUN 15  6 - 23 mg/dL   Creatinine, Ser 0.96  0.50 - 1.10 mg/dL   Calcium 9.2  8.4 - 04.5 mg/dL   GFR calc non Af Amer 79 (*) >90 mL/min   GFR calc Af Amer >90  >90 mL/min   Comment:            The eGFR has been calculated     using the CKD EPI equation.     This calculation has not been     validated in all clinical     situations.     eGFR's persistently     <90 mL/min signify     possible Chronic Kidney Disease.    Brief narrative  79 year old female, resident of memory care unit/ALF, PMH of advanced dementia, CAD, stroke, A. fib was admitted on 02/22/13  with one week history of progressively worsening mental status changes (less responsive) and reduced by mouth intake. In the ED, obtunded,sodium 169, chloride >130, creatinine 1.22, WBC 13.6, CT head without acute findings and positive UA. Hospitalist admission was requested.   Assessment/Plan:  1. Acute metabolic encephalopathy: Likely secondary to hypernatremia and acute renal failure complicating underlying advanced dementia. Mental status probably is return to baseline. 2. Hypernatremic dehydration: Likely precipitated by what was initially thought to be UTI and poor oral intake. Improving. Reduce IV fluids and follow BMP in a.m. 3. Acute renal failure: Secondary to dehydration. No hydronephrosis on renal ultrasound. Resolved. 4. UTI: Debris in the bladder on renal ultrasound. Urine culture negative-DC IV Rocephin 5. Hypokalemia: Repleted 6. Chronic A. fib: Controlled ventricular rate. Not on anticoagulation-possibly not a candidate secondary to fall risk from advanced dementia. Continue Cardizem and low dose aspirin. Heart rate is mostly controlled but occasionally has bradycardia in the 40s and asymptomatic. Cardizem decreased to 300 mg a day 7. Dementia: Continue Aricept and Namenda. Mental status probably at baseline. 8. Mild leukocytosis. 9. Hypertension: Fluctuating. Monitor. Continue Cardizem. 10. Palliative care recommendations 11. Code Status: DNR/DNI- 12. Symptom Control: 1. Weakness: Continue to encourage patient to function at baseline, work with  PT, and family to be aware of natural progression of disease and make decisions when change occurs  2. Dysphagia: Continued education regarding th natural trajectory of dementia as it relates to swallow and nutritional intake. Family Encouraged to make decions dependant on patients progess/decline, considering quality of life and her advanced directives 3. Psycho/Social: Emotional support offered, 4. Disposition: Hopeful for return to  [facility] with P[facility] to follow. Strongly encouraged to complete MOST form.      Family Communication: Discussed with patient's son Mr. Marinda Tyer and daughter Ms. Lynn at bedside on 5/21.  Disposition Plan: Discharge with palliative care followup at facility.    Diet Recommendation  Continue with Current Diet: Dysphagia 1 (puree);Thin liquid      Discharge Exam:    Blood pressure 138/40, pulse 48, temperature 97.3 F (36.3 C), temperature source Oral, resp. rate 20, height 5' (1.524 m), weight 47 kg (103 lb 9.9 oz), SpO2 98.00%.   General exam: Pleasant and comfortable.  Respiratory system: Clear. No increased work of breathing.  Cardiovascular system: S1 & S2 heard, irregularly irregular. No JVD, murmurs, gallops, clicks or pedal edema. Telemetry: Sinus rhythm-sinus bradycardia in the 50s, occasional sinus bradycardia in the 40s.  Gastrointestinal system: Abdomen is nondistended, soft and nontender. Normal bowel sounds heard.  Central nervous system: Alert and oriented only to self. No focal neurological deficits.  Extremities: Symmetric 5 x 5 power.       SignedRicharda Overlie 02/25/2013, 7:40 AM

## 2013-02-25 NOTE — Clinical Social Work Placement (Signed)
     Clinical Social Work Department CLINICAL SOCIAL WORK PLACEMENT NOTE 02/25/2013  Patient:  Caroline Goodman, Caroline Goodman  Account Number:  [id number] Admit date:  02/22/2013  Clinical Social Worker:  Becky Sax, LCSW  Date/time:  02/25/2013 12:00 M  Clinical Social Work is seeking post-discharge placement for this patient at the following level of care:   SKILLED NURSING   (*CSW will update this form in Epic as items are completed)   02/25/2013  Patient/family provided with [facility] Department of Clinical Social Works list of facilities offering this level of care within the geographic area requested by the patient (or if unable, by the patients family).  02/25/2013  Patient/family informed of their freedom to choose among providers that offer the needed level of care, that participate in Medicare, Medicaid or managed care program needed by the patient, have an available bed and are willing to accept the patient.  02/25/2013  Patient/family informed of [facility] ownership interest in [facility], as well as of the fact that they are under no obligation to receive care at this facility.  PASARR submitted to EDS on 02/25/2013 PASARR number received from EDS on 02/25/2013  FL2 transmitted to all facilities in geographic area requested by pt/family on  02/25/2013 FL2 transmitted to all facilities within larger geographic area on 02/25/2013  Patient informed that his/her managed care company has contracts with or will negotiate with  certain facilities, including the following:     Patient/family informed of bed offers received:  02/25/2013 Patient chooses bed at [facility] Physician recommends and patient chooses bed at    Patient to be transferred to [facility] on  02/25/2013 Patient to be transferred to facility by ptar  The following physician request were entered in Epic:   Additional Comments:

## 2013-03-24 NOTE — Consult Note (Signed)
I have reviewed and discussed the care of this patient in detail with the nurse practitioner including pertinent patient records, physical exam findings and data. I agree with details of this encounter.  

## 2013-04-17 ENCOUNTER — Encounter (HOSPITAL_COMMUNITY): Payer: Self-pay | Admitting: *Deleted

## 2013-04-17 ENCOUNTER — Emergency Department (HOSPITAL_COMMUNITY)

## 2013-04-17 ENCOUNTER — Emergency Department (HOSPITAL_COMMUNITY)
Admission: EM | Admit: 2013-04-17 | Discharge: 2013-04-18 | Disposition: A | Discharge status: Skilled Nursing Facility | Attending: Emergency Medicine | Admitting: Emergency Medicine

## 2013-04-17 DIAGNOSIS — Y9210 Unspecified residential institution as the place of occurrence of the external cause: Secondary | ICD-10-CM | POA: Insufficient documentation

## 2013-04-17 DIAGNOSIS — F411 Generalized anxiety disorder: Secondary | ICD-10-CM | POA: Insufficient documentation

## 2013-04-17 DIAGNOSIS — Y939 Activity, unspecified: Secondary | ICD-10-CM | POA: Insufficient documentation

## 2013-04-17 DIAGNOSIS — Z8673 Personal history of transient ischemic attack (TIA), and cerebral infarction without residual deficits: Secondary | ICD-10-CM | POA: Insufficient documentation

## 2013-04-17 DIAGNOSIS — Z79899 Other long term (current) drug therapy: Secondary | ICD-10-CM | POA: Insufficient documentation

## 2013-04-17 DIAGNOSIS — I4891 Unspecified atrial fibrillation: Secondary | ICD-10-CM | POA: Insufficient documentation

## 2013-04-17 DIAGNOSIS — S0101XA Laceration without foreign body of scalp, initial encounter: Secondary | ICD-10-CM

## 2013-04-17 DIAGNOSIS — W1809XA Striking against other object with subsequent fall, initial encounter: Secondary | ICD-10-CM | POA: Insufficient documentation

## 2013-04-17 DIAGNOSIS — S0003XA Contusion of scalp, initial encounter: Secondary | ICD-10-CM | POA: Insufficient documentation

## 2013-04-17 DIAGNOSIS — S0990XA Unspecified injury of head, initial encounter: Secondary | ICD-10-CM | POA: Insufficient documentation

## 2013-04-17 DIAGNOSIS — Z7982 Long term (current) use of aspirin: Secondary | ICD-10-CM | POA: Insufficient documentation

## 2013-04-17 DIAGNOSIS — S0100XA Unspecified open wound of scalp, initial encounter: Secondary | ICD-10-CM | POA: Insufficient documentation

## 2013-04-17 DIAGNOSIS — F0280 Dementia in other diseases classified elsewhere without behavioral disturbance: Secondary | ICD-10-CM | POA: Insufficient documentation

## 2013-04-17 DIAGNOSIS — Z951 Presence of aortocoronary bypass graft: Secondary | ICD-10-CM | POA: Insufficient documentation

## 2013-04-17 DIAGNOSIS — G309 Alzheimer's disease, unspecified: Secondary | ICD-10-CM | POA: Insufficient documentation

## 2013-04-17 DIAGNOSIS — I2510 Atherosclerotic heart disease of native coronary artery without angina pectoris: Secondary | ICD-10-CM | POA: Insufficient documentation

## 2013-04-17 NOTE — ED Provider Notes (Signed)
History    CSN: [id number] Arrival date & time 04/17/13  2019  First MD Initiated Contact with Patient 04/17/13 2035     Chief Complaint  Patient presents with  . Fall   (Consider location/radiation/quality/duration/timing/severity/associated sxs/prior Treatment) The history is provided by the nursing home and the EMS personnel. History limited by: Level V caveat: Dementia.   patient was sent from the memory care unit after falling backwards and hitting the knob on the dresser.  No reported loss consciousness.  As reported that she is at baseline mental status.  The patient has dementia.  She is not on any anticoagulants except for an 81 mg aspirin daily.  She denies pain at this time.  EMS reports no deformity of her lower extremities or upper extremities Past Medical History  Diagnosis Date  . Dementia   . Anxiety   . Coronary artery disease   . Stroke   . A-fib    Past Surgical History  Procedure Laterality Date  . Coronary artery bypass graft  2002   Family History  Problem Relation Age of Onset  . Congestive Heart Failure Mother   . Alcoholism Father    History  Substance Use Topics  . Smoking status: Never Smoker   . Smokeless tobacco: Never Used  . Alcohol Use: No   OB History   Grav Para Term Preterm Abortions TAB SAB Ect Mult Living                 Review of Systems  Unable to perform ROS: Dementia    Allergies  Review of patient's allergies indicates no known allergies.  Home Medications   Current Outpatient Rx  Name  Route  Sig  Dispense  Refill  . acetaminophen (TYLENOL) 325 MG tablet   Oral   Take 650 mg by mouth every morning.         Marland Kitchen ALPRAZolam (XANAX) 0.25 MG tablet   Oral   Take 0.25 mg by mouth 2 (two) times daily as needed for anxiety.         Marland Kitchen aspirin 81 MG chewable tablet   Oral   Chew 81 mg by mouth every morning.         . cholecalciferol (VITAMIN D) 1000 UNITS tablet   Oral   Take 1,000 Units by mouth every morning.           . diltiazem (CARDIZEM CD) 300 MG 24 hr capsule   Oral   Take 300 mg by mouth daily.         Marland Kitchen donepezil (ARICEPT) 10 MG tablet   Oral   Take 10 mg by mouth every evening.          Marland Kitchen losartan (COZAAR) 50 MG tablet   Oral   Take 50 mg by mouth daily.         . Menthol-Methyl Salicylate (THERA-GESIC) 1-15 % CREA   Apply externally   Apply 1 application topically at bedtime.         . pravastatin (PRAVACHOL) 10 MG tablet   Oral   Take 10 mg by mouth every evening.          . senna (SENOKOT) 8.6 MG TABS   Oral   Take 2 tablets by mouth 2 (two) times daily.         . traMADol (ULTRAM) 50 MG tablet   Oral   Take 50 mg by mouth 4 (four) times daily as needed. For pain.  There were no vitals taken for this visit. Physical Exam  Nursing note and vitals reviewed. Constitutional: She is oriented to person, place, and time. She appears well-developed and well-nourished. No distress.  HENT:  Head: Normocephalic.  2 cm-laceration to posterior scalp.  No active bleeding at this time.  Small associated hematoma  Eyes: EOM are normal.  Neck: Normal range of motion. Neck supple.  No cervical step-off or cervical tenderness  Cardiovascular: Normal rate, regular rhythm and normal heart sounds.   Pulmonary/Chest: Effort normal and breath sounds normal.  Abdominal: Soft. She exhibits no distension. There is no tenderness.  Musculoskeletal: Normal range of motion.  5 out of 5 strength in bilateral upper lower extremity major muscle groups.  Full range of motion bilateral hips  Neurological: She is alert and oriented to person, place, and time.  Skin: Skin is warm and dry.  Psychiatric: She has a normal mood and affect. Judgment normal.    ED Course  Procedures (including critical care time)  LACERATION REPAIR Performed by: Lyanne Co Consent: Verbal consent obtained. Risks and benefits: risks, benefits and alternatives were discussed Patient  identity confirmed: provided demographic data Time out performed prior to procedure Prepped and Draped in normal sterile fashion Wound explored Laceration Location: Posterior scalp Laceration Length: 2 cm No Foreign Bodies seen or palpated Anesthesia:  None  Amount of cleaning: standard Skin closure: Staples  Number of sutures or staples: 4  Technique: Staple  Patient tolerance: Patient tolerated the procedure well with no immediate complications.   Labs Reviewed - No data to display No results found. No diagnosis found.  MDM  CT head without acute findings.  Laceration repaired with staples.  No active bleeding at this time.  Discharge home back to the memory care unit  Lyanne Co, MD 04/17/13 2211

## 2013-04-17 NOTE — ED Notes (Signed)
From Assisted living at Alzheimer unit.  While husband pt., she fell backwards while hitting a knob on the dresser. She has a sm. Laceration, controlled bleeding, on back of head. No loc. No neck, no back pain on palpation. Staff state, "pt. At baseline."  No piv. cbg 13. 158/74, hr 48 and irregular.

## 2013-05-05 ENCOUNTER — Emergency Department (HOSPITAL_COMMUNITY)
Admission: EM | Admit: 2013-05-05 | Discharge: 2013-05-05 | Disposition: A | Discharge status: Home / Self Care | Attending: Emergency Medicine | Admitting: Emergency Medicine

## 2013-05-05 ENCOUNTER — Emergency Department (HOSPITAL_COMMUNITY)

## 2013-05-05 ENCOUNTER — Encounter (HOSPITAL_COMMUNITY): Payer: Self-pay | Admitting: Emergency Medicine

## 2013-05-05 DIAGNOSIS — F411 Generalized anxiety disorder: Secondary | ICD-10-CM | POA: Insufficient documentation

## 2013-05-05 DIAGNOSIS — Y9210 Unspecified residential institution as the place of occurrence of the external cause: Secondary | ICD-10-CM | POA: Insufficient documentation

## 2013-05-05 DIAGNOSIS — Y939 Activity, unspecified: Secondary | ICD-10-CM | POA: Insufficient documentation

## 2013-05-05 DIAGNOSIS — W19XXXA Unspecified fall, initial encounter: Secondary | ICD-10-CM | POA: Insufficient documentation

## 2013-05-05 DIAGNOSIS — Z8679 Personal history of other diseases of the circulatory system: Secondary | ICD-10-CM | POA: Insufficient documentation

## 2013-05-05 DIAGNOSIS — Z7982 Long term (current) use of aspirin: Secondary | ICD-10-CM | POA: Insufficient documentation

## 2013-05-05 DIAGNOSIS — I2510 Atherosclerotic heart disease of native coronary artery without angina pectoris: Secondary | ICD-10-CM | POA: Insufficient documentation

## 2013-05-05 DIAGNOSIS — Y92129 Unspecified place in nursing home as the place of occurrence of the external cause: Secondary | ICD-10-CM

## 2013-05-05 DIAGNOSIS — Z79899 Other long term (current) drug therapy: Secondary | ICD-10-CM | POA: Insufficient documentation

## 2013-05-05 DIAGNOSIS — F0390 Unspecified dementia without behavioral disturbance: Secondary | ICD-10-CM | POA: Insufficient documentation

## 2013-05-05 DIAGNOSIS — Z8673 Personal history of transient ischemic attack (TIA), and cerebral infarction without residual deficits: Secondary | ICD-10-CM | POA: Insufficient documentation

## 2013-05-05 DIAGNOSIS — IMO0002 Reserved for concepts with insufficient information to code with codable children: Secondary | ICD-10-CM | POA: Insufficient documentation

## 2013-05-05 DIAGNOSIS — T148XXA Other injury of unspecified body region, initial encounter: Secondary | ICD-10-CM

## 2013-05-05 DIAGNOSIS — Z951 Presence of aortocoronary bypass graft: Secondary | ICD-10-CM | POA: Insufficient documentation

## 2013-05-05 MED ORDER — ACETAMINOPHEN 325 MG PO TABS
650.0000 mg | ORAL_TABLET | Freq: Once | ORAL | Status: AC
  Administered 2013-05-05: 650 mg via ORAL
  Filled 2013-05-05: qty 2

## 2013-05-05 NOTE — Discharge Instructions (Signed)
If you were given medicines take as directed.  If you are on coumadin or contraceptives realize their levels and effectiveness is altered by many different medicines.  If you have any reaction (rash, tongues swelling, other) to the medicines stop taking and see a physician.   °Please follow up as directed and return to the ER or see a physician for new or worsening symptoms.  Thank you. ° ° °

## 2013-05-05 NOTE — ED Notes (Signed)
[facility]  called for transportation to [facility]

## 2013-05-05 NOTE — ED Notes (Signed)
Daughter called up date on care given

## 2013-05-05 NOTE — ED Notes (Signed)
Pt here via ems for s/p fall non witnessed found for rt side hx Alzheimer unable to answer questions concerning pain placed in c-collar

## 2013-05-05 NOTE — Progress Notes (Addendum)
Room WA 5 - Felisa Bonier - [facility] RN Visit-R.Jene Huq RN  Related admission to [facility] diagnosis of Alzheimers.   Pt is DNR code with OOF DNR on shadow chart.    Pt alert, confused, sitting up on ED stretcher picking at cervical collar without complaints of pain or discomfort.    No family present.  Pt has bruises on LUE.  Patient's home medication list is on shadow chart.   Per staff RN and chart notes, pt is being discharged back to [facility] this evening via [facility].  Please call [facility] @ [phone]- ask for RN Liaison or after hours,ask for on-call RN with any hospice needs.   Thank you.  Joneen Boers, RN  [facility]  Hospice Liaison

## 2013-05-05 NOTE — ED Notes (Signed)
[id number]<JY> Expected date:<BR> Expected time:<BR> Means of arrival:<BR> Comments:<BR> EMS-fall

## 2013-05-05 NOTE — ED Provider Notes (Signed)
CSN: [id number]     Arrival date & time 05/05/13  1612 History     First MD Initiated Contact with Patient 05/05/13 1629     Chief Complaint  Patient presents with  . Fall   (Consider location/radiation/quality/duration/timing/severity/associated sxs/prior Treatment) HPI Comments: 79 yo female with demnetia, htn, a fib presents after unwitnessed fall at [facility].  Pt at baseline, smiling.  Abrasions left dorsal wrist.  No fevers or recent illness.  No signs of pain.  Level 5 cav due to dementia.  Nursing spoke with family on phone.    Patient is a 79 y.o. female presenting with fall. The history is provided by the nursing home and a relative.  Fall This is a new problem.    Past Medical History  Diagnosis Date  . Dementia   . Anxiety   . Coronary artery disease   . Stroke   . A-fib    Past Surgical History  Procedure Laterality Date  . Coronary artery bypass graft  2002   Family History  Problem Relation Age of Onset  . Congestive Heart Failure Mother   . Alcoholism Father    History  Substance Use Topics  . Smoking status: Never Smoker   . Smokeless tobacco: Never Used  . Alcohol Use: No   OB History   Grav Para Term Preterm Abortions TAB SAB Ect Mult Living                 Review of Systems  Unable to perform ROS: Dementia    Allergies  Review of patient's allergies indicates no known allergies.  Home Medications   Current Outpatient Rx  Name  Route  Sig  Dispense  Refill  . acetaminophen (TYLENOL) 325 MG tablet   Oral   Take 650 mg by mouth every morning.         Marland Kitchen aspirin 81 MG chewable tablet   Oral   Chew 81 mg by mouth every morning.         . cholecalciferol (VITAMIN D) 1000 UNITS tablet   Oral   Take 1,000 Units by mouth every morning.          . diltiazem (CARDIZEM CD) 300 MG 24 hr capsule   Oral   Take 300 mg by mouth daily.         Marland Kitchen docusate (COLACE) 50 MG/5ML liquid   Oral   Take 50 mg by mouth daily.         Marland Kitchen  donepezil (ARICEPT) 10 MG tablet   Oral   Take 10 mg by mouth every evening.          Marland Kitchen losartan (COZAAR) 50 MG tablet   Oral   Take 50 mg by mouth daily.         . Menthol-Methyl Salicylate (THERA-GESIC) 1-15 % CREA   Apply externally   Apply 1 application topically at bedtime.         . pravastatin (PRAVACHOL) 10 MG tablet   Oral   Take 10 mg by mouth every evening.          . senna (SENOKOT) 8.6 MG TABS   Oral   Take 2 tablets by mouth 2 (two) times daily.         . traMADol (ULTRAM) 50 MG tablet   Oral   Take 50 mg by mouth 4 (four) times daily as needed. For pain.          BP 155/48  Pulse  83  Temp(Src) 98.5 F (36.9 C) (Oral)  Resp 18  SpO2 97% Physical Exam  Nursing note and vitals reviewed. Constitutional: She appears well-developed and well-nourished.  HENT:  Head: Normocephalic and atraumatic.  Eyes: Conjunctivae are normal. Right eye exhibits no discharge. Left eye exhibits no discharge.  Neck: Normal range of motion. Neck supple. No tracheal deviation present.  Cardiovascular: Normal rate and regular rhythm.   Pulmonary/Chest: Effort normal and breath sounds normal.  Abdominal: Soft. She exhibits no distension. There is no tenderness. There is no guarding.  Musculoskeletal: She exhibits no edema and no tenderness.  Neurological: She is alert. She has normal strength. GCS eye subscore is 4. GCS verbal subscore is 5. GCS motor subscore is 6.  Full rom of all extremities without weakness or discomfort, Difficult exam due to dementia horiz eye movements intact perrl No droop  Skin: Skin is warm.  2 2 cm small skin tears left doral wrist/ distal forearm.   Psychiatric: She has a normal mood and affect.    ED Course   Procedures (including critical care time)  Labs Reviewed - No data to display No results found. No diagnosis found.  MDM  CT and xrays. No distress, at baseline. Dg Pelvis 1-2 Views  05/05/2013   *RADIOLOGY REPORT*   Clinical Data: Dementia and fall.  PELVIS - 1-2 VIEW  Comparison: None.  Findings: No acute fracture is identified.  Marked degenerative changes are noted involving the right hip joint.  There is no evidence of diastasis.  No bony lesions or destruction identified. Soft tissues are unremarkable.  Focal calcification in the central pelvis most likely relates to degenerated uterine fibroid tissue.  IMPRESSION: No acute pelvic fracture or visible hip fracture.   Original Report Authenticated By: Irish Lack, M.D.   Dg Wrist Complete Left  05/05/2013   *RADIOLOGY REPORT*  Clinical Data: Fall with left wrist injury.  LEFT WRIST - COMPLETE 3+ VIEW  Comparison: None.  Findings: No acute fracture or dislocation is identified.  Marked degenerative changes are seen involving the first carpometacarpal joint and the radiocarpal joint.  Bones are diffusely osteopenic. No focal bony lesions or destruction identified.  Soft tissues are unremarkable.  IMPRESSION: No acute fracture.  Significant degenerative changes present.   Original Report Authenticated By: Irish Lack, M.D.   Ct Head Wo Contrast  05/05/2013   *RADIOLOGY REPORT*  Clinical Data:  Fall with head injury.  CT HEAD WITHOUT CONTRAST CT CERVICAL SPINE WITHOUT CONTRAST  Technique:  Multidetector CT imaging of the head and cervical spine was performed following the standard protocol without intravenous contrast.  Multiplanar CT image reconstructions of the cervical spine were also generated.  Comparison:  04/17/2013  CT HEAD  Findings: Stable advanced small vessel disease in the periventricular white matter.  Stable cortical atrophy. The brain demonstrates no evidence of hemorrhage, infarction, edema, mass effect, extra-axial fluid collection, hydrocephalus or mass lesion. The skull is unremarkable.  IMPRESSION: No acute findings.  Stable small vessel disease and atrophy.  CT CERVICAL SPINE  Findings: No cervical fracture or subluxation is identified.  There  is severe spondylosis throughout the cervical spine with significant loss of disc space heights and significant osteophyte formation spanning from the C3 level to the T1 level.  There is a large central osteophyte complex protruding posteriorly at C6-7 causing moderate spinal canal narrowing.  Multiple bilateral neural foramina show narrowing due to proliferative changes.  There is no evidence of soft tissue swelling.  The visualized airway  is normally patent.  Small cystic nodule in the right lobe of the thyroid measures 5 mm and has a benign appearance.  IMPRESSION: No evidence of cervical fracture.  Severe degenerative disease is present throughout the cervical spine.  A large osteophyte projects posteriorly at the C6-7 level causing moderate spinal canal narrowing.   Original Report Authenticated By: Irish Lack, M.D.   Ct Cervical Spine Wo Contrast  05/05/2013   *RADIOLOGY REPORT*  Clinical Data:  Fall with head injury.  CT HEAD WITHOUT CONTRAST CT CERVICAL SPINE WITHOUT CONTRAST  Technique:  Multidetector CT imaging of the head and cervical spine was performed following the standard protocol without intravenous contrast.  Multiplanar CT image reconstructions of the cervical spine were also generated.  Comparison:  04/17/2013  CT HEAD  Findings: Stable advanced small vessel disease in the periventricular white matter.  Stable cortical atrophy. The brain demonstrates no evidence of hemorrhage, infarction, edema, mass effect, extra-axial fluid collection, hydrocephalus or mass lesion. The skull is unremarkable.  IMPRESSION: No acute findings.  Stable small vessel disease and atrophy.  CT CERVICAL SPINE  Findings: No cervical fracture or subluxation is identified.  There is severe spondylosis throughout the cervical spine with significant loss of disc space heights and significant osteophyte formation spanning from the C3 level to the T1 level.  There is a large central osteophyte complex protruding  posteriorly at C6-7 causing moderate spinal canal narrowing.  Multiple bilateral neural foramina show narrowing due to proliferative changes.  There is no evidence of soft tissue swelling.  The visualized airway is normally patent.  Small cystic nodule in the right lobe of the thyroid measures 5 mm and has a benign appearance.  IMPRESSION: No evidence of cervical fracture.  Severe degenerative disease is present throughout the cervical spine.  A large osteophyte projects posteriorly at the C6-7 level causing moderate spinal canal narrowing.   Original Report Authenticated By: Irish Lack, M.D.   Well appearing.  DC to NH.    Enid Skeens, MD 05/05/13 [phone]

## 2014-01-25 ENCOUNTER — Emergency Department (HOSPITAL_COMMUNITY)
Admission: EM | Admit: 2014-01-25 | Discharge: 2014-01-26 | Disposition: A | Discharge status: Home / Self Care | Payer: Medicare Other | Attending: Emergency Medicine | Admitting: Emergency Medicine

## 2014-01-25 ENCOUNTER — Emergency Department (HOSPITAL_COMMUNITY): Payer: Medicare Other

## 2014-01-25 ENCOUNTER — Encounter (HOSPITAL_COMMUNITY): Payer: Self-pay | Admitting: Emergency Medicine

## 2014-01-25 DIAGNOSIS — Z951 Presence of aortocoronary bypass graft: Secondary | ICD-10-CM | POA: Insufficient documentation

## 2014-01-25 DIAGNOSIS — I2510 Atherosclerotic heart disease of native coronary artery without angina pectoris: Secondary | ICD-10-CM | POA: Insufficient documentation

## 2014-01-25 DIAGNOSIS — Z8673 Personal history of transient ischemic attack (TIA), and cerebral infarction without residual deficits: Secondary | ICD-10-CM | POA: Insufficient documentation

## 2014-01-25 DIAGNOSIS — Y9210 Unspecified residential institution as the place of occurrence of the external cause: Secondary | ICD-10-CM | POA: Insufficient documentation

## 2014-01-25 DIAGNOSIS — Z79899 Other long term (current) drug therapy: Secondary | ICD-10-CM | POA: Insufficient documentation

## 2014-01-25 DIAGNOSIS — Z7982 Long term (current) use of aspirin: Secondary | ICD-10-CM | POA: Insufficient documentation

## 2014-01-25 DIAGNOSIS — W19XXXA Unspecified fall, initial encounter: Secondary | ICD-10-CM | POA: Insufficient documentation

## 2014-01-25 DIAGNOSIS — F411 Generalized anxiety disorder: Secondary | ICD-10-CM | POA: Insufficient documentation

## 2014-01-25 DIAGNOSIS — F0390 Unspecified dementia without behavioral disturbance: Secondary | ICD-10-CM | POA: Insufficient documentation

## 2014-01-25 DIAGNOSIS — Y939 Activity, unspecified: Secondary | ICD-10-CM | POA: Insufficient documentation

## 2014-01-25 DIAGNOSIS — Z043 Encounter for examination and observation following other accident: Secondary | ICD-10-CM | POA: Insufficient documentation

## 2014-01-25 DIAGNOSIS — I4891 Unspecified atrial fibrillation: Secondary | ICD-10-CM | POA: Insufficient documentation

## 2014-01-25 LAB — BASIC METABOLIC PANEL
BUN: 26 mg/dL — ABNORMAL HIGH (ref 6–23)
CO2: 27 mEq/L (ref 19–32)
Calcium: 9.8 mg/dL (ref 8.4–10.5)
Chloride: 106 mEq/L (ref 96–112)
Creatinine, Ser: 0.95 mg/dL (ref 0.50–1.10)
GFR, EST AFRICAN AMERICAN: 63 mL/min — AB (ref >90)
GFR, EST NON AFRICAN AMERICAN: 55 mL/min — AB (ref >90)
Glucose, Bld: 119 mg/dL — ABNORMAL HIGH (ref 70–99)
POTASSIUM: 3.9 meq/L (ref 3.7–5.3)
SODIUM: 146 meq/L (ref 137–147)

## 2014-01-25 LAB — CBC
HCT: 36.1 % (ref 36.0–46.0)
HEMOGLOBIN: 12.4 g/dL (ref 12.0–15.0)
MCH: 31.2 pg (ref 26.0–34.0)
MCHC: 34.3 g/dL (ref 30.0–36.0)
MCV: 90.7 fL (ref 78.0–100.0)
Platelets: 180 10*3/uL (ref 150–400)
RBC: 3.98 MIL/uL (ref 3.87–5.11)
RDW: 13 % (ref 11.5–15.5)
WBC: 7.1 10*3/uL (ref 4.0–10.5)

## 2014-01-25 NOTE — ED Provider Notes (Signed)
CSN: [id number]     Arrival date & time 01/25/14  2117 History   First MD Initiated Contact with Patient 01/25/14 2129     Chief Complaint  Patient presents with  . Fall   LEVEL 5 CAVEAT DUE TO DEMENTIA.  (Consider location/radiation/quality/duration/timing/severity/associated sxs/prior Treatment) HPI Patient with dementia sent in by Her SNF for FALL. Unwitnessed. Patient called out and was not down long. No overt deformites or other abnormalities. At baseline mental status per SNF. Past Medical History  Diagnosis Date  . Dementia   . Anxiety   . Coronary artery disease   . Stroke   . A-fib    Past Surgical History  Procedure Laterality Date  . Coronary artery bypass graft  2002   Family History  Problem Relation Age of Onset  . Congestive Heart Failure Mother   . Alcoholism Father    History  Substance Use Topics  . Smoking status: Never Smoker   . Smokeless tobacco: Never Used  . Alcohol Use: No   OB History   Grav Para Term Preterm Abortions TAB SAB Ect Mult Living                 Review of Systems  Unable to perform ROS: Dementia      Allergies  Review of patient's allergies indicates no known allergies.  Home Medications   Prior to Admission medications   Medication Sig Start Date End Date Taking? Authorizing Provider  acetaminophen (TYLENOL) 325 MG tablet Take 650 mg by mouth every morning.   Yes Historical Provider, MD  aspirin 81 MG chewable tablet Chew 81 mg by mouth every morning.   Yes Historical Provider, MD  calcium-vitamin D (OSCAL WITH D) 500-200 MG-UNIT per tablet Take 1 tablet by mouth.   Yes Historical Provider, MD  diltiazem (CARDIZEM CD) 300 MG 24 hr capsule Take 300 mg by mouth daily.   Yes Historical Provider, MD  losartan (COZAAR) 50 MG tablet Take 50 mg by mouth daily.   Yes Historical Provider, MD  Menthol-Methyl Salicylate (THERA-GESIC) 1-15 % CREA Apply 1 application topically at bedtime.   Yes Historical Provider, MD  pravastatin  (PRAVACHOL) 10 MG tablet Take 10 mg by mouth every evening.    Yes Historical Provider, MD  sennosides-docusate sodium (SENOKOT-S) 8.6-50 MG tablet Take 1 tablet by mouth at bedtime.   Yes Historical Provider, MD  donepezil (ARICEPT) 10 MG tablet Take 10 mg by mouth every evening.     Historical Provider, MD   BP 160/48  Pulse 60  Temp(Src) 97.7 F (36.5 C) (Axillary)  Resp 20  SpO2 98% Physical Exam  Constitutional: She appears well-developed and well-nourished. No distress.  HENT:  Head: Normocephalic and atraumatic.  Right Ear: External ear normal.  Left Ear: External ear normal.  No abrasions or hematomas seen or palpated  Eyes: Conjunctivae and EOM are normal. Pupils are equal, round, and reactive to light. No scleral icterus.  Neck: Normal range of motion. Neck supple.  Cardiovascular: Normal rate, regular rhythm and normal heart sounds.  Exam reveals no gallop and no friction rub.   No murmur heard. Pulmonary/Chest: Effort normal and breath sounds normal. No respiratory distress.  Abdominal: Soft. Bowel sounds are normal. She exhibits no distension and no mass. There is no tenderness. There is no guarding.  Musculoskeletal:  No midline spinal tenderness,.  Neurological: She is alert.  Skin: Skin is warm and dry. She is not diaphoretic.    ED Course  Procedures (including critical  care time) Labs Review Labs Reviewed  BASIC METABOLIC PANEL - Abnormal; Notable for the following:    Glucose, Bld 119 (*)    BUN 26 (*)    GFR calc non Af Amer 55 (*)    GFR calc Af Amer 63 (*)    All other components within normal limits  CBC  URINALYSIS, ROUTINE W REFLEX MICROSCOPIC    Imaging Review Ct Head Wo Contrast  01/25/2014   CLINICAL DATA:  Headache.  Confusion per  EXAM: CT HEAD WITHOUT CONTRAST  TECHNIQUE: Contiguous axial images were obtained from the base of the skull through the vertex without intravenous contrast.  COMPARISON:  CT C SPINE W/O CM dated 05/05/2013  FINDINGS:  Global atrophy. Chronic ischemic changes. No mass effect, midline shift, or acute intracranial hemorrhage. Air-fluid levels in the maxillary sinuses. Mucosal thickening throughout the paranasal sinuses. Mastoid air cells are clear.  IMPRESSION: No acute intracranial pathology. Inflammatory changes in the paranasal sinuses   Electronically Signed   By: Maryclare BeanArt  Hoss M.D.   On: 01/25/2014 23:06     EKG Interpretation None      MDM   Final diagnoses:  Fall    Elderly patient with dementia. Fall at nursing home. At baseline mental status. No signs of injury on PE. No blood thinners. Negative imaging. Appears safe to return to SNF      Arthor CaptainAbigail Enio Hornback, PA-C 01/27/14 0719

## 2014-01-25 NOTE — Discharge Instructions (Signed)

## 2014-01-25 NOTE — ED Notes (Signed)
Pt from [facility]. Staff states pt had unwitnessed fall and hematoma in occipital region. Hematoma not found upon EMS assessment. Alert per baseline.

## 2014-01-26 LAB — URINALYSIS, ROUTINE W REFLEX MICROSCOPIC
Glucose, UA: NEGATIVE mg/dL
Hgb urine dipstick: NEGATIVE
Ketones, ur: NEGATIVE mg/dL
NITRITE: POSITIVE — AB
Protein, ur: 30 mg/dL — AB
SPECIFIC GRAVITY, URINE: 1.022 (ref 1.005–1.030)
UROBILINOGEN UA: 1 mg/dL (ref 0.0–1.0)
pH: 5.5 (ref 5.0–8.0)

## 2014-01-26 LAB — URINE MICROSCOPIC-ADD ON

## 2014-01-27 NOTE — ED Provider Notes (Signed)
Medical screening examination/treatment/procedure(s) were performed by non-physician practitioner and as supervising physician I was immediately available for consultation/collaboration.   EKG Interpretation None       Olivia Mackielga M Chandria Rookstool, MD 01/27/14 [phone]

## 2014-01-27 NOTE — ED Provider Notes (Signed)
Medical screening examination/treatment/procedure(s) were performed by non-physician practitioner and as supervising physician I was immediately available for consultation/collaboration.   EKG Interpretation None       Avilyn Virtue M Rebbeca Sheperd, MD 01/27/14 0726 

## 2014-01-27 NOTE — ED Provider Notes (Signed)
7:22 AM Patient labs reviewed from Visit on 01/25/2014. Positive for UTI. I was able to contact the SNF where she lives. RX and orders were faxed to Nurse Shawna at 3:20 PM on 01/26/2014 for Keflex 500 mg 1 QID #20.    Arthor CaptainAbigail Kalayah Leske, PA-C 01/27/14 0725

## 2014-03-26 ENCOUNTER — Encounter (HOSPITAL_COMMUNITY): Payer: Self-pay | Admitting: Emergency Medicine

## 2014-03-26 ENCOUNTER — Emergency Department (HOSPITAL_COMMUNITY): Payer: Medicare Other

## 2014-03-26 ENCOUNTER — Emergency Department (HOSPITAL_COMMUNITY)
Admission: EM | Admit: 2014-03-26 | Discharge: 2014-03-26 | Disposition: A | Discharge status: Home / Self Care | Payer: Medicare Other | Attending: Emergency Medicine | Admitting: Emergency Medicine

## 2014-03-26 DIAGNOSIS — G309 Alzheimer's disease, unspecified: Secondary | ICD-10-CM | POA: Insufficient documentation

## 2014-03-26 DIAGNOSIS — Z23 Encounter for immunization: Secondary | ICD-10-CM | POA: Insufficient documentation

## 2014-03-26 DIAGNOSIS — Z951 Presence of aortocoronary bypass graft: Secondary | ICD-10-CM | POA: Insufficient documentation

## 2014-03-26 DIAGNOSIS — Y9210 Unspecified residential institution as the place of occurrence of the external cause: Secondary | ICD-10-CM | POA: Insufficient documentation

## 2014-03-26 DIAGNOSIS — F0280 Dementia in other diseases classified elsewhere without behavioral disturbance: Secondary | ICD-10-CM | POA: Insufficient documentation

## 2014-03-26 DIAGNOSIS — Z79899 Other long term (current) drug therapy: Secondary | ICD-10-CM | POA: Diagnosis not present

## 2014-03-26 DIAGNOSIS — S0100XA Unspecified open wound of scalp, initial encounter: Secondary | ICD-10-CM | POA: Diagnosis not present

## 2014-03-26 DIAGNOSIS — Z7982 Long term (current) use of aspirin: Secondary | ICD-10-CM | POA: Diagnosis not present

## 2014-03-26 DIAGNOSIS — R296 Repeated falls: Secondary | ICD-10-CM | POA: Diagnosis not present

## 2014-03-26 DIAGNOSIS — S0101XA Laceration without foreign body of scalp, initial encounter: Secondary | ICD-10-CM

## 2014-03-26 DIAGNOSIS — I2510 Atherosclerotic heart disease of native coronary artery without angina pectoris: Secondary | ICD-10-CM | POA: Insufficient documentation

## 2014-03-26 DIAGNOSIS — I4891 Unspecified atrial fibrillation: Secondary | ICD-10-CM | POA: Insufficient documentation

## 2014-03-26 DIAGNOSIS — Z8673 Personal history of transient ischemic attack (TIA), and cerebral infarction without residual deficits: Secondary | ICD-10-CM | POA: Insufficient documentation

## 2014-03-26 DIAGNOSIS — Y939 Activity, unspecified: Secondary | ICD-10-CM | POA: Diagnosis not present

## 2014-03-26 DIAGNOSIS — W19XXXA Unspecified fall, initial encounter: Secondary | ICD-10-CM

## 2014-03-26 MED ORDER — TETANUS-DIPHTH-ACELL PERTUSSIS 5-2.5-18.5 LF-MCG/0.5 IM SUSP
0.5000 mL | Freq: Once | INTRAMUSCULAR | Status: AC
  Administered 2014-03-26: 0.5 mL via INTRAMUSCULAR
  Filled 2014-03-26: qty 0.5

## 2014-03-26 NOTE — ED Notes (Signed)
Bed: WA02 Expected date:  Expected time:  Means of arrival:  Comments: EMS 80yo F, fall. Laceration to head, no blood thinners

## 2014-03-26 NOTE — ED Provider Notes (Signed)
CSN: [id number]     Arrival date & time 03/26/14  2023 History   First MD Initiated Contact with Patient 03/26/14 2028     Chief Complaint  Patient presents with  . Fall  . Head Laceration     (Consider location/radiation/quality/duration/timing/severity/associated sxs/prior Treatment) HPI 80 year old female presents with an unwitnessed fall from her nursing facility. Patient has a history of Alzheimer's dementia and is unable to provide a history. EMS in the nursing home states she is at her normal mental baseline. He suffered a left sided head laceration and a skin tear on her left elbow. No other complaints.  Past Medical History  Diagnosis Date  . Dementia   . Anxiety   . Coronary artery disease   . Stroke   . A-fib    Past Surgical History  Procedure Laterality Date  . Coronary artery bypass graft  2002   Family History  Problem Relation Age of Onset  . Congestive Heart Failure Mother   . Alcoholism Father    History  Substance Use Topics  . Smoking status: Never Smoker   . Smokeless tobacco: Never Used  . Alcohol Use: No   OB History   Grav Para Term Preterm Abortions TAB SAB Ect Mult Living                 Review of Systems  Unable to perform ROS: Dementia      Allergies  Review of patient's allergies indicates no known allergies.  Home Medications   Prior to Admission medications   Medication Sig Start Date End Date Taking? Authorizing Jimmye Wisnieski  acetaminophen (TYLENOL) 325 MG tablet Take 650 mg by mouth every morning.    Historical Annlee Glandon, MD  aspirin 81 MG chewable tablet Chew 81 mg by mouth every morning.    Historical Jelena Malicoat, MD  calcium-vitamin D (OSCAL WITH D) 500-200 MG-UNIT per tablet Take 1 tablet by mouth.    Historical Christabel Camire, MD  diltiazem (CARDIZEM CD) 300 MG 24 hr capsule Take 300 mg by mouth daily.    Historical Koren Plyler, MD  donepezil (ARICEPT) 10 MG tablet Take 10 mg by mouth every evening.     Historical Treyce Spillers, MD  losartan  (COZAAR) 50 MG tablet Take 50 mg by mouth daily.    Historical Deaaron Fulghum, MD  Menthol-Methyl Salicylate (THERA-GESIC) 1-15 % CREA Apply 1 application topically at bedtime.    Historical Jenika Chiem, MD  pravastatin (PRAVACHOL) 10 MG tablet Take 10 mg by mouth every evening.     Historical Khamya Topp, MD  sennosides-docusate sodium (SENOKOT-S) 8.6-50 MG tablet Take 1 tablet by mouth at bedtime.    Historical Sheresa Cullop, MD   BP 163/66  Pulse 64  Temp(Src) 98.2 F (36.8 C) (Oral)  Resp 14  SpO2 100% Physical Exam  Nursing note and vitals reviewed. Constitutional: She appears well-developed and well-nourished.  HENT:  Head: Normocephalic. Head is with laceration.    Right Ear: External ear normal.  Left Ear: External ear normal.  Nose: Nose normal.  Eyes: Right eye exhibits no discharge. Left eye exhibits no discharge.  Cardiovascular: Normal rate, regular rhythm and normal heart sounds.   Pulmonary/Chest: Effort normal and breath sounds normal.  Abdominal: Soft. She exhibits no distension. There is no tenderness.  Musculoskeletal:       Left elbow: She exhibits laceration (skin tear).  Neurological: She is alert. She is disoriented.  Skin: Skin is warm and dry.    ED Course  Procedures (including critical care time) Labs Review Labs  Reviewed - No data to display  Imaging Review Ct Head Wo Contrast  03/26/2014   CLINICAL DATA:  Alzheimer's dementia patient with unwitnessed fall. Left-sided head laceration.  EXAM: CT HEAD WITHOUT CONTRAST  TECHNIQUE: Contiguous axial images were obtained from the base of the skull through the vertex without intravenous contrast.  COMPARISON:  01/25/2014  FINDINGS: Diffuse cerebral atrophy. Ventricular dilatation consistent with central atrophy. Low-attenuation changes in the deep white matter consistent with small vessel ischemia. No mass effect or midline shift. No abnormal extra-axial fluid collections. Gray-white matter junctions are distinct. Basal  cisterns are not effaced. No evidence of acute intracranial hemorrhage. No depressed skull fractures. Visualized paranasal sinuses and mastoid air cells are not opacified. Small subcutaneous scalp hematoma over the left parietal region. Vascular calcifications.  IMPRESSION: No acute intracranial abnormalities. Chronic appearing atrophy and small vessel ischemic changes.   Electronically Signed   By: Burman NievesWilliam  Stevens M.D.   On: 03/26/2014 21:15     EKG Interpretation None      MDM   Final diagnoses:  Scalp laceration, initial encounter  Fall, initial encounter    Patient acting at normal baseline per facility. No other areas of trauma besides elbow and superficial scalp lac. Repaired at bedside by PA. Otherwise has history of frequent falls. No other concerning signs on exam or in vitals to be concerned about infection or sepsis. Will d/c back to facility.    Audree CamelScott T Goldston, MD 03/26/14 2337

## 2014-03-26 NOTE — ED Notes (Signed)
Patient transported to CT 

## 2014-03-26 NOTE — ED Notes (Signed)
Pt is al[facility]'s dementia [facility]. Had unwitnessed fall. Denies pain. L sideded head lac. Bleeding controlled. Also skin tear on L elbow.

## 2014-03-26 NOTE — ED Provider Notes (Signed)
LACERATION REPAIR Date/Time: 03/26/2014 9:51 PM Performed by: Coral CeoPALMER, JESSICA K Authorized by: Jillyn LedgerPALMER, JESSICA K Consent: Verbal consent obtained. Consent given by: patient Patient identity confirmed: verbally with patient Body area: head/neck Location details: scalp Laceration length: 2 cm Foreign bodies: no foreign bodies Tendon involvement: none Nerve involvement: none Vascular damage: no Anesthesia: local infiltration Local anesthetic: lidocaine 1% without epinephrine Anesthetic total: 1 ml Patient sedated: no Preparation: Patient was prepped and draped in the usual sterile fashion. Irrigation solution: saline Irrigation method: syringe Amount of cleaning: standard Debridement: none Degree of undermining: none Skin closure: staples Number of sutures: 2 Technique: simple Approximation: close Approximation difficulty: simple Patient tolerance: Patient tolerated the procedure well with no immediate complications.     Jillyn LedgerJessica K Palmer, PA-C 03/26/14 2151

## 2014-03-26 NOTE — ED Notes (Signed)
[facility] transport called 

## 2014-03-27 NOTE — ED Provider Notes (Signed)
Medical screening examination/treatment/procedure(s) were performed by non-physician practitioner and as supervising physician I was immediately available for consultation/collaboration.   EKG Interpretation None        Audree CamelScott T Loucille Takach, MD 03/27/14 [phone]

## 2014-06-25 ENCOUNTER — Emergency Department (HOSPITAL_COMMUNITY)

## 2014-06-25 ENCOUNTER — Encounter (HOSPITAL_COMMUNITY): Payer: Self-pay | Admitting: Emergency Medicine

## 2014-06-25 ENCOUNTER — Emergency Department (HOSPITAL_COMMUNITY)
Admission: EM | Admit: 2014-06-25 | Discharge: 2014-06-25 | Disposition: A | Discharge status: Home / Self Care | Attending: Emergency Medicine | Admitting: Emergency Medicine

## 2014-06-25 DIAGNOSIS — W19XXXA Unspecified fall, initial encounter: Secondary | ICD-10-CM

## 2014-06-25 DIAGNOSIS — I2510 Atherosclerotic heart disease of native coronary artery without angina pectoris: Secondary | ICD-10-CM | POA: Diagnosis not present

## 2014-06-25 DIAGNOSIS — Z951 Presence of aortocoronary bypass graft: Secondary | ICD-10-CM | POA: Insufficient documentation

## 2014-06-25 DIAGNOSIS — Z79899 Other long term (current) drug therapy: Secondary | ICD-10-CM | POA: Diagnosis not present

## 2014-06-25 DIAGNOSIS — S59909A Unspecified injury of unspecified elbow, initial encounter: Secondary | ICD-10-CM | POA: Insufficient documentation

## 2014-06-25 DIAGNOSIS — Y9389 Activity, other specified: Secondary | ICD-10-CM | POA: Insufficient documentation

## 2014-06-25 DIAGNOSIS — R296 Repeated falls: Secondary | ICD-10-CM | POA: Diagnosis not present

## 2014-06-25 DIAGNOSIS — Y929 Unspecified place or not applicable: Secondary | ICD-10-CM | POA: Diagnosis not present

## 2014-06-25 DIAGNOSIS — S6990XA Unspecified injury of unspecified wrist, hand and finger(s), initial encounter: Secondary | ICD-10-CM | POA: Diagnosis present

## 2014-06-25 DIAGNOSIS — Z7982 Long term (current) use of aspirin: Secondary | ICD-10-CM | POA: Insufficient documentation

## 2014-06-25 DIAGNOSIS — Z8673 Personal history of transient ischemic attack (TIA), and cerebral infarction without residual deficits: Secondary | ICD-10-CM | POA: Insufficient documentation

## 2014-06-25 DIAGNOSIS — N390 Urinary tract infection, site not specified: Secondary | ICD-10-CM

## 2014-06-25 DIAGNOSIS — S46909A Unspecified injury of unspecified muscle, fascia and tendon at shoulder and upper arm level, unspecified arm, initial encounter: Secondary | ICD-10-CM | POA: Diagnosis not present

## 2014-06-25 DIAGNOSIS — S4980XA Other specified injuries of shoulder and upper arm, unspecified arm, initial encounter: Secondary | ICD-10-CM | POA: Diagnosis not present

## 2014-06-25 DIAGNOSIS — F0390 Unspecified dementia without behavioral disturbance: Secondary | ICD-10-CM | POA: Insufficient documentation

## 2014-06-25 DIAGNOSIS — I4891 Unspecified atrial fibrillation: Secondary | ICD-10-CM | POA: Insufficient documentation

## 2014-06-25 DIAGNOSIS — S59919A Unspecified injury of unspecified forearm, initial encounter: Secondary | ICD-10-CM

## 2014-06-25 DIAGNOSIS — S51009A Unspecified open wound of unspecified elbow, initial encounter: Secondary | ICD-10-CM | POA: Diagnosis not present

## 2014-06-25 LAB — URINE MICROSCOPIC-ADD ON

## 2014-06-25 LAB — URINALYSIS, ROUTINE W REFLEX MICROSCOPIC
Bilirubin Urine: NEGATIVE
Glucose, UA: NEGATIVE mg/dL
Ketones, ur: NEGATIVE mg/dL
NITRITE: POSITIVE — AB
PH: 5.5 (ref 5.0–8.0)
Protein, ur: NEGATIVE mg/dL
SPECIFIC GRAVITY, URINE: 1.017 (ref 1.005–1.030)
UROBILINOGEN UA: 0.2 mg/dL (ref 0.0–1.0)

## 2014-06-25 MED ORDER — CEPHALEXIN 500 MG PO CAPS
500.0000 mg | ORAL_CAPSULE | Freq: Two times a day (BID) | ORAL | Status: AC
Start: 2014-06-25 — End: ?

## 2014-06-25 MED ORDER — CEPHALEXIN 500 MG PO CAPS
500.0000 mg | ORAL_CAPSULE | Freq: Once | ORAL | Status: AC
  Administered 2014-06-25: 500 mg via ORAL
  Filled 2014-06-25: qty 1

## 2014-06-25 NOTE — ED Notes (Signed)
[facility] and [facility] called.

## 2014-06-25 NOTE — ED Notes (Signed)
Bed: WA02 Expected date:  Expected time:  Means of arrival:  Comments: ems 

## 2014-06-25 NOTE — Discharge Instructions (Signed)
Fall Prevention and Home Safety  Falls cause injuries and can affect all age groups. It is possible to use preventive measures to significantly decrease the likelihood of falls. There are many simple measures which can make your home safer and prevent falls.  OUTDOORS   Repair cracks and edges of walkways and driveways.   Remove high doorway thresholds.   Trim shrubbery on the main path into your home.   Have good outside lighting.   Clear walkways of tools, rocks, debris, and clutter.   Check that handrails are not broken and are securely fastened. Both sides of steps should have handrails.   Have leaves, snow, and ice cleared regularly.   Use sand or salt on walkways during winter months.   In the garage, clean up grease or oil spills.  BATHROOM   Install night lights.   Install grab bars by the toilet and in the tub and shower.   Use non-skid mats or decals in the tub or shower.   Place a plastic non-slip stool in the shower to sit on, if needed.   Keep floors dry and clean up all water on the floor immediately.   Remove soap buildup in the tub or shower on a regular basis.   Secure bath mats with non-slip, double-sided rug tape.   Remove throw rugs and tripping hazards from the floors.  BEDROOMS   Install night lights.   Make sure a bedside light is easy to reach.   Do not use oversized bedding.   Keep a telephone by your bedside.   Have a firm chair with side arms to use for getting dressed.   Remove throw rugs and tripping hazards from the floor.  KITCHEN   Keep handles on pots and pans turned toward the center of the stove. Use back burners when possible.   Clean up spills quickly and allow time for drying.   Avoid walking on wet floors.   Avoid hot utensils and knives.   Position shelves so they are not too high or low.   Place commonly used objects within easy reach.   If necessary, use a sturdy step stool with a grab bar when reaching.   Keep electrical cables out of the  way.   Do not use floor polish or wax that makes floors slippery. If you must use wax, use non-skid floor wax.   Remove throw rugs and tripping hazards from the floor.  STAIRWAYS   Never leave objects on stairs.   Place handrails on both sides of stairways and use them. Fix any loose handrails. Make sure handrails on both sides of the stairways are as long as the stairs.   Check carpeting to make sure it is firmly attached along stairs. Make repairs to worn or loose carpet promptly.   Avoid placing throw rugs at the top or bottom of stairways, or properly secure the rug with carpet tape to prevent slippage. Get rid of throw rugs, if possible.   Have an electrician put in a light switch at the top and bottom of the stairs.  OTHER FALL PREVENTION TIPS   Wear low-heel or rubber-soled shoes that are supportive and fit well. Wear closed toe shoes.   When using a stepladder, make sure it is fully opened and both spreaders are firmly locked. Do not climb a closed stepladder.   Add color or contrast paint or tape to grab bars and handrails in your home. Place contrasting color strips on first and last   steps.   Learn and use mobility aids as needed. Install an electrical emergency response system.   Turn on lights to avoid dark areas. Replace light bulbs that burn out immediately. Get light switches that glow.   Arrange furniture to create clear pathways. Keep furniture in the same place.   Firmly attach carpet with non-skid or double-sided tape.   Eliminate uneven floor surfaces.   Select a carpet pattern that does not visually hide the edge of steps.   Be aware of all pets.  OTHER HOME SAFETY TIPS   Set the water temperature for 120 F (48.8 C).   Keep emergency numbers on or near the telephone.   Keep smoke detectors on every level of the home and near sleeping areas.  Document Released: 09/12/2002 Document Revised: 03/23/2012 Document Reviewed: 12/12/2011  ExitCare Patient Information 2015  [employer]. This information is not intended to replace advice given to you by your health care provider. Make sure you discuss any questions you have with your health care provider.    Urinary Tract Infection  Urinary tract infections (UTIs) can develop anywhere along your urinary tract. Your urinary tract is your body's drainage system for removing wastes and extra water. Your urinary tract includes two kidneys, two ureters, a bladder, and a urethra. Your kidneys are a pair of bean-shaped organs. Each kidney is about the size of your fist. They are located below your ribs, one on each side of your spine.  CAUSES  Infections are caused by microbes, which are microscopic organisms, including fungi, viruses, and bacteria. These organisms are so small that they can only be seen through a microscope. Bacteria are the microbes that most commonly cause UTIs.  SYMPTOMS   Symptoms of UTIs may vary by age and gender of the patient and by the location of the infection. Symptoms in young women typically include a frequent and intense urge to urinate and a painful, burning feeling in the bladder or urethra during urination. Older women and men are more likely to be tired, shaky, and weak and have muscle aches and abdominal pain. A fever may mean the infection is in your kidneys. Other symptoms of a kidney infection include pain in your back or sides below the ribs, nausea, and vomiting.  DIAGNOSIS  To diagnose a UTI, your caregiver will ask you about your symptoms. Your caregiver also will ask to provide a urine sample. The urine sample will be tested for bacteria and white blood cells. White blood cells are made by your body to help fight infection.  TREATMENT   Typically, UTIs can be treated with medication. Because most UTIs are caused by a bacterial infection, they usually can be treated with the use of antibiotics. The choice of antibiotic and length of treatment depend on your symptoms and the type of bacteria causing  your infection.  HOME CARE INSTRUCTIONS   If you were prescribed antibiotics, take them exactly as your caregiver instructs you. Finish the medication even if you feel better after you have only taken some of the medication.   Drink enough water and fluids to keep your urine clear or pale yellow.   Avoid caffeine, tea, and carbonated beverages. They tend to irritate your bladder.   Empty your bladder often. Avoid holding urine for long periods of time.   Empty your bladder before and after sexual intercourse.   After a bowel movement, women should cleanse from front to back. Use each tissue only once.  SEEK MEDICAL CARE   IF:    You have back pain.   You develop a fever.   Your symptoms do not begin to resolve within 3 days.  SEEK IMMEDIATE MEDICAL CARE IF:    You have severe back pain or lower abdominal pain.   You develop chills.   You have nausea or vomiting.   You have continued burning or discomfort with urination.  MAKE SURE YOU:    Understand these instructions.   Will watch your condition.   Will get help right away if you are not doing well or get worse.  Document Released: 07/02/2005 Document Revised: 03/23/2012 Document Reviewed: 10/31/2011  ExitCare Patient Information 2015 [employer]. This information is not intended to replace advice given to you by your health care provider. Make sure you discuss any questions you have with your health care provider.

## 2014-06-25 NOTE — ED Notes (Signed)
Per EMS, Pt from [facility] living, DNR. Pt had unwitnessed fall approximately 45 minutes ago. Pt has dementia, nonverbal. Facility sts pt was holding her R shoulder, pt found laying on R shoulder. No obvious deformity, symmetrical, good ROM. Oriented to baseline. Pt is immobilized, unable to do spinal clearance with pt. No deformity noted to back, no indications of pain upon palpation. Pelvis stable, no shortening or rotation noted.  Pt does have abrasion to L elbow. Pt normally ambulates independently.

## 2014-06-25 NOTE — ED Provider Notes (Signed)
CSN: [id number]     Arrival date & time 06/25/14  1742 History   First MD Initiated Contact with Patient 06/25/14 1747     Chief Complaint  Patient presents with  . Fall     (Consider location/radiation/quality/duration/timing/severity/associated sxs/prior Treatment) HPI Comments: 80 year old female with a history of dementia presenting after an unwitnessed fall. Patient unable to provide any history. All history obtained through nursing report. Level V caveat applies secondary to dementia.  Patient is a 80 y.o. female presenting with fall.  Fall This is a new problem. The current episode started less than 1 hour ago. The problem has been resolved. Associated symptoms comments: Right shoulder pain.    Past Medical History  Diagnosis Date  . Dementia   . Anxiety   . Coronary artery disease   . Stroke   . A-fib    Past Surgical History  Procedure Laterality Date  . Coronary artery bypass graft  2002   Family History  Problem Relation Age of Onset  . Congestive Heart Failure Mother   . Alcoholism Father    History  Substance Use Topics  . Smoking status: Never Smoker   . Smokeless tobacco: Never Used  . Alcohol Use: No   OB History   Grav Para Term Preterm Abortions TAB SAB Ect Mult Living                 Review of Systems  Unable to perform ROS: Dementia      Allergies  Review of patient's allergies indicates no known allergies.  Home Medications   Prior to Admission medications   Medication Sig Start Date End Date Taking? Authorizing Provider  acetaminophen (TYLENOL) 325 MG tablet Take 650 mg by mouth every morning.   Yes Historical Provider, MD  aspirin 81 MG chewable tablet Chew 81 mg by mouth every morning.   Yes Historical Provider, MD  bisacodyl (DULCOLAX) 10 MG suppository Place 10 mg rectally as needed (constipation.).   Yes Historical Provider, MD  calcium-vitamin D (OSCAL WITH D) 500-200 MG-UNIT per tablet Take 1 tablet by mouth.   Yes Historical  Provider, MD  diltiazem (CARDIZEM CD) 300 MG 24 hr capsule Take 300 mg by mouth daily.   Yes Historical Provider, MD  losartan (COZAAR) 50 MG tablet Take 50 mg by mouth daily.   Yes Historical Provider, MD  Menthol-Methyl Salicylate (THERA-GESIC) 1-15 % CREA Apply 1 application topically at bedtime.   Yes Historical Provider, MD  pravastatin (PRAVACHOL) 10 MG tablet Take 10 mg by mouth every evening.    Yes Historical Provider, MD  sennosides-docusate sodium (SENOKOT-S) 8.6-50 MG tablet Take 1 tablet by mouth 2 (two) times daily.    Yes Historical Provider, MD  Skin Protectants, Misc. (BAZA PROTECT EX) Apply topically as needed. As directed.   Yes Historical Provider, MD   BP 157/55  Pulse 85  Temp(Src) 97.7 F (36.5 C) (Oral)  Resp 16  SpO2 100% Physical Exam  Nursing note and vitals reviewed. Constitutional: She appears well-developed and well-nourished. No distress.  HENT:  Head: Normocephalic and atraumatic. Head is without raccoon's eyes and without Battle's sign.  Nose: Nose normal.  Eyes: Conjunctivae and EOM are normal. Pupils are equal, round, and reactive to light. No scleral icterus.  Neck: No spinous process tenderness and no muscular tenderness present.  Cardiovascular: Normal rate, regular rhythm, normal heart sounds and intact distal pulses.   No murmur heard. Pulmonary/Chest: Effort normal and breath sounds normal. She has no rales. She  exhibits no tenderness.  Abdominal: Soft. There is no tenderness. There is no rebound and no guarding.  Musculoskeletal: Normal range of motion. She exhibits no edema and no tenderness.       Thoracic back: She exhibits no tenderness and no bony tenderness.       Lumbar back: She exhibits no tenderness and no bony tenderness.  No evidence of trauma to extremities, except as noted.  2+ distal pulses.    Left elbow: Small skin tear without apparent tenderness with full range of motion without apparent pain.  Neurological: She is alert.  She is disoriented.  Skin: Skin is warm and dry. No rash noted.  Psychiatric:  Happy, giggling throughout exam.    ED Course  Procedures (including critical care time) Labs Review Labs Reviewed  URINALYSIS, ROUTINE W REFLEX MICROSCOPIC - Abnormal; Notable for the following:    APPearance TURBID (*)    Hgb urine dipstick SMALL (*)    Nitrite POSITIVE (*)    Leukocytes, UA LARGE (*)    All other components within normal limits  URINE MICROSCOPIC-ADD ON - Abnormal; Notable for the following:    Bacteria, UA MANY (*)    All other components within normal limits    Imaging Review Dg Shoulder Right  06/25/2014   CLINICAL DATA:  Right shoulder injury during a unwitnessed fall.  EXAM: RIGHT SHOULDER - 2+ VIEW  COMPARISON:  None.  FINDINGS: Low acromial humeral distance, probably from a chronic rotator cuff tear. Acromioclavicular and subacromial spurring. There is also mild spurring of the humeral head.  No fracture or dislocation observed. Atherosclerotic aortic arch. Abnormal interstitial accentuation in the lungs, increased from 02/22/2013.  IMPRESSION: 1. Increased abnormal interstitial accentuation in the lungs, query Mild congestive heart failure. Chest radiography recommended. 2. No acute bony findings. 3. Suspected chronic rotator cuff tear on the right. Subacromial spur and spurring from the [facility] joint predispose to impingement.   Electronically Signed   By: Herbie Baltimore M.D.   On: 06/25/2014 19:34   Ct Head Wo Contrast  06/25/2014   CLINICAL DATA:  Unwitnessed fall.  Demented patient.  Nonverbal.  EXAM: CT HEAD WITHOUT CONTRAST  CT CERVICAL SPINE WITHOUT CONTRAST.  TECHNIQUE: Contiguous axial images were obtained from the base of the skull through the vertex without intravenous contrast.  COMPARISON:  03/26/2014  FINDINGS: HEAD CT  Ventricles normal configuration. There is ventricular and sulcal enlargement reflecting moderate to advanced atrophy.  No parenchymal masses or mass effect.  No evidence of a recent cortical infarct.  Patchy white matter hypoattenuation noted consistent with moderate chronic microvascular ischemic change.  No extra-axial masses or abnormal fluid collections.  No intracranial hemorrhage.  Visualized sinuses and mastoid air cells are clear. No skull fracture.  CERVICAL CT  No fracture. No spondylolisthesis. Degenerative changes are noted throughout the cervical spine with moderate loss disc height from C3-C4 through the upper thoracic spine. There is spondylotic disc bulging along with endplate spurring with varying degrees of neural foraminal narrowing. Bones are demineralized. Soft tissues show vascular calcifications but are otherwise unremarkable. Lung apices show mild areas of scarring.  IMPRESSION: HEAD CT:  No acute intracranial abnormalities.  CERVICAL CT:  No fracture or acute finding.   Electronically Signed   By: Amie Portland M.D.   On: 06/25/2014 19:58   Ct Cervical Spine Wo Contrast  06/25/2014   CLINICAL DATA:  Unwitnessed fall.  Demented patient.  Nonverbal.  EXAM: CT HEAD WITHOUT CONTRAST  CT CERVICAL SPINE  WITHOUT CONTRAST.  TECHNIQUE: Contiguous axial images were obtained from the base of the skull through the vertex without intravenous contrast.  COMPARISON:  03/26/2014  FINDINGS: HEAD CT  Ventricles normal configuration. There is ventricular and sulcal enlargement reflecting moderate to advanced atrophy.  No parenchymal masses or mass effect. No evidence of a recent cortical infarct.  Patchy white matter hypoattenuation noted consistent with moderate chronic microvascular ischemic change.  No extra-axial masses or abnormal fluid collections.  No intracranial hemorrhage.  Visualized sinuses and mastoid air cells are clear. No skull fracture.  CERVICAL CT  No fracture. No spondylolisthesis. Degenerative changes are noted throughout the cervical spine with moderate loss disc height from C3-C4 through the upper thoracic spine. There is spondylotic disc  bulging along with endplate spurring with varying degrees of neural foraminal narrowing. Bones are demineralized. Soft tissues show vascular calcifications but are otherwise unremarkable. Lung apices show mild areas of scarring.  IMPRESSION: HEAD CT:  No acute intracranial abnormalities.  CERVICAL CT:  No fracture or acute finding.   Electronically Signed   By: Amie Portland M.D.   On: 06/25/2014 19:58  All radiology studies independently viewed by me.      EKG Interpretation None      MDM   Final diagnoses:  Fall, initial encounter  UTI (lower urinary tract infection)    80 year old female with a history of dementia presenting with an unwitnessed fall.  Patient's daughter came to the ED and stated that her mother was at her baseline mental status. Imaging negative for acute injuries. Plain films of the shoulder question some mild CHF. Patient not hypoxic, no increased work of breathing, no peripheral edema. Elevation his chest imaging today. Plan DC home with treatment for her UTI.   Candyce Churn III, MD 06/25/14 2135

## 2014-06-28 LAB — URINE CULTURE

## 2014-06-29 ENCOUNTER — Telehealth (HOSPITAL_COMMUNITY): Payer: Self-pay

## 2014-06-29 NOTE — ED Notes (Signed)
Post ED Visit - Positive Culture Follow-up  Culture report reviewed by antimicrobial stewardship pharmacist:  Wes Dulaney, Pharm.D., BCPS  Celedonio Miyamoto, Pharm.D., BCPS  Georgina Pillion, Pharm.D., BCPS  [place], [place].D., BCPS, AAHIVP  Estella Husk, Pharm.D., BCPS, AAHIVP  Carly Sabat, Pharm.D.  Enzo Bi, [address].D.  Positive urine culture Treated with cephalexin, organism sensitive to the same and no further patient follow-up is required at this time.  Ashley Jacobs 06/29/2014, 12:34 PM

## 2014-12-22 ENCOUNTER — Encounter (HOSPITAL_COMMUNITY): Payer: Self-pay | Admitting: Emergency Medicine

## 2014-12-22 ENCOUNTER — Emergency Department (HOSPITAL_COMMUNITY): Payer: Medicare Other

## 2014-12-22 ENCOUNTER — Emergency Department (HOSPITAL_COMMUNITY)
Admission: EM | Admit: 2014-12-22 | Discharge: 2014-12-22 | Disposition: A | Discharge status: Home / Self Care | Payer: Medicare Other | Attending: Emergency Medicine | Admitting: Emergency Medicine

## 2014-12-22 DIAGNOSIS — W19XXXA Unspecified fall, initial encounter: Secondary | ICD-10-CM

## 2014-12-22 DIAGNOSIS — F0390 Unspecified dementia without behavioral disturbance: Secondary | ICD-10-CM | POA: Diagnosis not present

## 2014-12-22 DIAGNOSIS — Z792 Long term (current) use of antibiotics: Secondary | ICD-10-CM | POA: Diagnosis not present

## 2014-12-22 DIAGNOSIS — Z8673 Personal history of transient ischemic attack (TIA), and cerebral infarction without residual deficits: Secondary | ICD-10-CM | POA: Diagnosis not present

## 2014-12-22 DIAGNOSIS — X58XXXA Exposure to other specified factors, initial encounter: Secondary | ICD-10-CM | POA: Diagnosis not present

## 2014-12-22 DIAGNOSIS — Z951 Presence of aortocoronary bypass graft: Secondary | ICD-10-CM | POA: Insufficient documentation

## 2014-12-22 DIAGNOSIS — S50311A Abrasion of right elbow, initial encounter: Secondary | ICD-10-CM | POA: Diagnosis not present

## 2014-12-22 DIAGNOSIS — S7000XA Contusion of unspecified hip, initial encounter: Secondary | ICD-10-CM

## 2014-12-22 DIAGNOSIS — I4891 Unspecified atrial fibrillation: Secondary | ICD-10-CM | POA: Insufficient documentation

## 2014-12-22 DIAGNOSIS — I2510 Atherosclerotic heart disease of native coronary artery without angina pectoris: Secondary | ICD-10-CM | POA: Insufficient documentation

## 2014-12-22 DIAGNOSIS — S7001XA Contusion of right hip, initial encounter: Secondary | ICD-10-CM | POA: Diagnosis not present

## 2014-12-22 DIAGNOSIS — Y998 Other external cause status: Secondary | ICD-10-CM | POA: Insufficient documentation

## 2014-12-22 DIAGNOSIS — Z8659 Personal history of other mental and behavioral disorders: Secondary | ICD-10-CM | POA: Insufficient documentation

## 2014-12-22 DIAGNOSIS — Z23 Encounter for immunization: Secondary | ICD-10-CM | POA: Insufficient documentation

## 2014-12-22 DIAGNOSIS — Y92129 Unspecified place in nursing home as the place of occurrence of the external cause: Secondary | ICD-10-CM | POA: Diagnosis not present

## 2014-12-22 DIAGNOSIS — S79911A Unspecified injury of right hip, initial encounter: Secondary | ICD-10-CM | POA: Diagnosis present

## 2014-12-22 DIAGNOSIS — Z7982 Long term (current) use of aspirin: Secondary | ICD-10-CM | POA: Diagnosis not present

## 2014-12-22 DIAGNOSIS — Z79899 Other long term (current) drug therapy: Secondary | ICD-10-CM | POA: Insufficient documentation

## 2014-12-22 DIAGNOSIS — Y9389 Activity, other specified: Secondary | ICD-10-CM | POA: Insufficient documentation

## 2014-12-22 LAB — CBC WITH DIFFERENTIAL/PLATELET
BASOS PCT: 0 % (ref 0–1)
Basophils Absolute: 0 10*3/uL (ref 0.0–0.1)
EOS ABS: 0.1 10*3/uL (ref 0.0–0.7)
EOS PCT: 1 % (ref 0–5)
HEMATOCRIT: 41.7 % (ref 36.0–46.0)
Hemoglobin: 14.4 g/dL (ref 12.0–15.0)
Lymphocytes Relative: 20 % (ref 12–46)
Lymphs Abs: 2.3 10*3/uL (ref 0.7–4.0)
MCH: 32.2 pg (ref 26.0–34.0)
MCHC: 34.5 g/dL (ref 30.0–36.0)
MCV: 93.3 fL (ref 78.0–100.0)
Monocytes Absolute: 0.7 10*3/uL (ref 0.1–1.0)
Monocytes Relative: 6 % (ref 3–12)
Neutro Abs: 8.5 10*3/uL — ABNORMAL HIGH (ref 1.7–7.7)
Neutrophils Relative %: 73 % (ref 43–77)
PLATELETS: 228 10*3/uL (ref 150–400)
RBC: 4.47 MIL/uL (ref 3.87–5.11)
RDW: 13 % (ref 11.5–15.5)
WBC: 11.5 10*3/uL — AB (ref 4.0–10.5)

## 2014-12-22 LAB — BASIC METABOLIC PANEL
ANION GAP: 11 (ref 5–15)
BUN: 14 mg/dL (ref 6–23)
CALCIUM: 10.4 mg/dL (ref 8.4–10.5)
CHLORIDE: 108 mmol/L (ref 96–112)
CO2: 27 mmol/L (ref 19–32)
Creatinine, Ser: 0.64 mg/dL (ref 0.50–1.10)
GFR calc non Af Amer: 81 mL/min — ABNORMAL LOW (ref >90)
Glucose, Bld: 109 mg/dL — ABNORMAL HIGH (ref 70–99)
Potassium: 4.2 mmol/L (ref 3.5–5.1)
Sodium: 146 mmol/L — ABNORMAL HIGH (ref 135–145)

## 2014-12-22 MED ORDER — TETANUS-DIPHTH-ACELL PERTUSSIS 5-2.5-18.5 LF-MCG/0.5 IM SUSP
0.5000 mL | Freq: Once | INTRAMUSCULAR | Status: AC
  Administered 2014-12-22: 0.5 mL via INTRAMUSCULAR
  Filled 2014-12-22: qty 0.5

## 2014-12-22 NOTE — ED Notes (Signed)
Per [facility] Patient was found in her memory care facility [facility]) on the floor. It is unknown how long she was on the ground or if she lost consciousness. When her right hip is palpated she grimaces. There is no shortening nor rotation. Pulses are palpable. There is a scrape on her right elbow. Pt range of motion is equal on both sides. Per facility staff, pt is at her baseline and has dementia. Pt is mostly nonverbal. There are no obvious deformities or abrasions other than on her right elbow.

## 2014-12-22 NOTE — Discharge Instructions (Signed)
It was our pleasure to provide your ER care today - we hope that you feel better.  Fall precautions.  Follow up with primary care doctor in coming week.  Return to ER if worse, new symptoms, severe pain, fevers, other concern.      Fall Prevention and Home Safety Falls cause injuries and can affect all age groups. It is possible to use preventive measures to significantly decrease the likelihood of falls. There are many simple measures which can make your home safer and prevent falls. OUTDOORS  Repair cracks and edges of walkways and driveways.  Remove high doorway thresholds.  Trim shrubbery on the main path into your home.  Have good outside lighting.  Clear walkways of tools, rocks, debris, and clutter.  Check that handrails are not broken and are securely fastened. Both sides of steps should have handrails.  Have leaves, snow, and ice cleared regularly.  Use sand or salt on walkways during winter months.  In the garage, clean up grease or oil spills. BATHROOM  Install night lights.  Install grab bars by the toilet and in the tub and shower.  Use non-skid mats or decals in the tub or shower.  Place a plastic non-slip stool in the shower to sit on, if needed.  Keep floors dry and clean up all water on the floor immediately.  Remove soap buildup in the tub or shower on a regular basis.  Secure bath mats with non-slip, double-sided rug tape.  Remove throw rugs and tripping hazards from the floors. BEDROOMS  Install night lights.  Make sure a bedside light is easy to reach.  Do not use oversized bedding.  Keep a telephone by your bedside.  Have a firm chair with side arms to use for getting dressed.  Remove throw rugs and tripping hazards from the floor. KITCHEN  Keep handles on pots and pans turned toward the center of the stove. Use back burners when possible.  Clean up spills quickly and allow time for drying.  Avoid walking on wet  floors.  Avoid hot utensils and knives.  Position shelves so they are not too high or low.  Place commonly used objects within easy reach.  If necessary, use a sturdy step stool with a grab bar when reaching.  Keep electrical cables out of the way.  Do not use floor polish or wax that makes floors slippery. If you must use wax, use non-skid floor wax.  Remove throw rugs and tripping hazards from the floor. STAIRWAYS  Never leave objects on stairs.  Place handrails on both sides of stairways and use them. Fix any loose handrails. Make sure handrails on both sides of the stairways are as long as the stairs.  Check carpeting to make sure it is firmly attached along stairs. Make repairs to worn or loose carpet promptly.  Avoid placing throw rugs at the top or bottom of stairways, or properly secure the rug with carpet tape to prevent slippage. Get rid of throw rugs, if possible.  Have an electrician put in a light switch at the top and bottom of the stairs. OTHER FALL PREVENTION TIPS  Wear low-heel or rubber-soled shoes that are supportive and fit well. Wear closed toe shoes.  When using a stepladder, make sure it is fully opened and both spreaders are firmly locked. Do not climb a closed stepladder.  Add color or contrast paint or tape to grab bars and handrails in your home. Place contrasting color strips on first and last  steps.  Learn and use mobility aids as needed. Install an electrical emergency response system.  Turn on lights to avoid dark areas. Replace light bulbs that burn out immediately. Get light switches that glow.  Arrange furniture to create clear pathways. Keep furniture in the same place.  Firmly attach carpet with non-skid or double-sided tape.  Eliminate uneven floor surfaces.  Select a carpet pattern that does not visually hide the edge of steps.  Be aware of all pets. OTHER HOME SAFETY TIPS  Set the water temperature for 120 F (48.8 C).  Keep  emergency numbers on or near the telephone.  Keep smoke detectors on every level of the home and near sleeping areas. Document Released: 09/12/2002 Document Revised: 03/23/2012 Document Reviewed: 12/12/2011 [facility]oniumExitCare Patient Information 2015 [place]Care, [place]. This information is not intended to replace advice given to you by your health care provider. Make sure you discuss any questions you have with your health care provider.      Contusion A contusion is a deep bruise. Contusions happen when an injury causes bleeding under the skin. Signs of bruising include pain, puffiness (swelling), and discolored skin. The contusion may turn blue, purple, or yellow. HOME CARE   Put ice on the injured area.  Put ice in a plastic bag.  Place a towel between your skin and the bag.  Leave the ice on for 15-20 minutes, 03-04 times a day.  Only take medicine as told by your doctor.  Rest the injured area.  If possible, raise (elevate) the injured area to lessen puffiness. GET HELP RIGHT AWAY IF:   You have more bruising or puffiness.  You have pain that is getting worse.  Your puffiness or pain is not helped by medicine. MAKE SURE YOU:   Understand these instructions.  Will watch your condition.  Will get help right away if you are not doing well or get worse. Document Released: 03/10/2008 Document Revised: 12/15/2011 Document Reviewed: 07/28/2011 [facility] Patient Information 2015 [place]Care, [place]. This information is not intended to replace advice given to you by your health care provider. Make sure you discuss any questions you have with your health care provider.

## 2014-12-22 NOTE — ED Provider Notes (Signed)
CSN: [id number]     Arrival date & time 12/22/14  1323 History   First MD Initiated Contact with Patient 12/22/14 1326     Chief Complaint  Patient presents with  . Hip Pain     (Consider location/radiation/quality/duration/timing/severity/associated sxs/prior Treatment) HPI  81 year old female with a history of dementia presents from the memory care unit at [facility] after being found on the floor. The patient is demented and unable to provide a history. Nursing home reports that she is not altered from her normal baseline. The patient was sitting in a chair when the staff left her to go check on another patient. When they came back 20 minutes later she was laying on her right side on the floor. She was awake and alert but did not seem to really get up. The patient seemed to complain of right hip pain when EMS evaluated her. Due to this she was sent to the ER. She did not seem to hit her head. Is unknown if she passed out but she seemed with it and did not seem to. No complaints from the patient. Has not been ill recently.  Past Medical History  Diagnosis Date  . Dementia   . Anxiety   . Coronary artery disease   . Stroke   . A-fib    Past Surgical History  Procedure Laterality Date  . Coronary artery bypass graft  2002   Family History  Problem Relation Age of Onset  . Congestive Heart Failure Mother   . Alcoholism Father    History  Substance Use Topics  . Smoking status: Never Smoker   . Smokeless tobacco: Never Used  . Alcohol Use: No   OB History    No data available     Review of Systems  Unable to perform ROS: Dementia      Allergies  Review of patient's allergies indicates no known allergies.  Home Medications   Prior to Admission medications   Medication Sig Start Date End Date Taking? Authorizing Provider  acetaminophen (TYLENOL) 325 MG tablet Take 650 mg by mouth every morning.    Historical Provider, MD  aspirin 81 MG chewable tablet Chew 81  mg by mouth every morning.    Historical Provider, MD  bisacodyl (DULCOLAX) 10 MG suppository Place 10 mg rectally as needed (constipation.).    Historical Provider, MD  calcium-vitamin D (OSCAL WITH D) 500-200 MG-UNIT per tablet Take 1 tablet by mouth.    Historical Provider, MD  cephALEXin (KEFLEX) 500 MG capsule Take 1 capsule (500 mg total) by mouth 2 (two) times daily. 06/25/14   Blake Divine, MD  diltiazem (CARDIZEM CD) 300 MG 24 hr capsule Take 300 mg by mouth daily.    Historical Provider, MD  losartan (COZAAR) 50 MG tablet Take 50 mg by mouth daily.    Historical Provider, MD  Menthol-Methyl Salicylate (THERA-GESIC) 1-15 % CREA Apply 1 application topically at bedtime.    Historical Provider, MD  pravastatin (PRAVACHOL) 10 MG tablet Take 10 mg by mouth every evening.     Historical Provider, MD  sennosides-docusate sodium (SENOKOT-S) 8.6-50 MG tablet Take 1 tablet by mouth 2 (two) times daily.     Historical Provider, MD  Skin Protectants, Misc. (BAZA PROTECT EX) Apply topically as needed. As directed.    Historical Provider, MD   BP 155/62 mmHg  Pulse 47  Temp(Src) 98.1 F (36.7 C) (Axillary)  Resp 16  SpO2 98% Physical Exam  Constitutional: She appears well-developed and well-nourished.  HENT:  Head: Normocephalic and atraumatic.  Right Ear: External ear normal.  Left Ear: External ear normal.  Nose: Nose normal.  Eyes: Right eye exhibits no discharge. Left eye exhibits no discharge.  Cardiovascular: Normal rate, regular rhythm and normal heart sounds.   Pulmonary/Chest: Effort normal and breath sounds normal.  Abdominal: Soft. There is no tenderness.  Musculoskeletal:       Right elbow: She exhibits normal range of motion. No tenderness found.       Right hip: She exhibits normal range of motion and no tenderness.       Left hip: She exhibits normal range of motion and no tenderness.       Arms: Neurological: She is alert. She is disoriented.  Skin: Skin is warm and dry.    Nursing note and vitals reviewed.   ED Course  Procedures (including critical care time) Labs Review Labs Reviewed  CBC WITH DIFFERENTIAL/PLATELET - Abnormal; Notable for the following:    WBC 11.5 (*)    Neutro Abs 8.5 (*)    All other components within normal limits  BASIC METABOLIC PANEL    Imaging Review Dg Chest 1 View  12/22/2014   CLINICAL DATA:  Fall today.  Dementia.  EXAM: CHEST  1 VIEW  COMPARISON:  Radiograph 02/22/2013  FINDINGS: Sternotomy wires overlie normal cardiac silhouette. No effusion, infiltrate, or pneumothorax. Mild central venous pulmonary congestion.  IMPRESSION: Mild central venous congestion.  No evidence of thoracic trauma   Electronically Signed   By: Genevive BiStewart  Edmunds M.D.   On: 12/22/2014 15:25   Dg Elbow Complete Right  12/22/2014   CLINICAL DATA:  Fall with right elbow laceration. Initial encounter.  EXAM: RIGHT ELBOW - COMPLETE 3+ VIEW  COMPARISON:  None.  FINDINGS: No fracture (minimal irregularity of the ventral radial neck in the lateral projection is not convincing). No dislocation or joint effusion. Osteopenia.  IMPRESSION: No acute osseous findings.   Electronically Signed   By: Marnee SpringJonathon  Watts M.D.   On: 12/22/2014 15:25   Dg Hip Unilat With Pelvis 2-3 Views Right  12/22/2014   CLINICAL DATA:  Fall. Dementia with limited ability to report symptoms.  EXAM: RIGHT HIP (WITH PELVIS) 2-3 VIEWS  COMPARISON:  None.  FINDINGS: There is no convincing right hip, left hip, or pelvic ring fracture. Asymmetric right hip osteoarthritis with bulky osteophytes and axial joint narrowing.  Prominent rectal stool burden.  Incidental calcified fibroid in the right pelvis.  IMPRESSION: 1. No acute osseous findings. 2. Advanced right hip osteoarthritis. 3. Rectal distention by stool.   Electronically Signed   By: Marnee SpringJonathon  Watts M.D.   On: 12/22/2014 15:29     EKG Interpretation   Date/Time:  Friday December 22 2014 13:29:19 EDT Ventricular Rate:  62 PR Interval:     QRS Duration: 84 QT Interval:  418 QTC Calculation: 424 R Axis:   15 Text Interpretation:  Atrial flutter with predominant 4:1 AV block LVH  with secondary repolarization abnormality Non-specific ST-t changes  Confirmed by Denton LankSTEINL  MD, Caryn BeeKEVIN ([id number]) on 12/22/2014 3:55:33 PM      MDM   Final diagnoses:  Fall, initial encounter    Patient with unwitnessed fall. Elbow abrasion but no tenderness. Hip seemed tender for EMS but I am able to range it here. Xray unremarkable. Able to ambulate without difficulty. Doubt occult hip fracture. Labs pending given unwitnessed fall. No significant change in EKG. Labs pending when checked out to Dr. Denton LankSteinl, if labs unremarkable she is  stable to be discharged back to facility.     Pricilla Loveless, MD 12/22/14 [phone]

## 2014-12-22 NOTE — ED Notes (Signed)
Ambulated pt in hallway with assistance. Once pt was able to stand, Pt was steady and could walk on her own with stand-by assistance.

## 2014-12-22 NOTE — ED Provider Notes (Signed)
Signed out by Dr Criss AlvineGoldston at [phone] that when labs return, d/c to home, pt ambulatory and xrays neg.  Labs c/w baseline.   Pt afeb. No distress.   Pt appears stable for d/c per Dr Alric RanGoldston's plan.    Cathren LaineKevin Joseline Mccampbell, MD 12/22/14 [phone]

## 2014-12-24 ENCOUNTER — Encounter (HOSPITAL_COMMUNITY): Payer: Self-pay | Admitting: Nurse Practitioner

## 2014-12-24 ENCOUNTER — Emergency Department (HOSPITAL_COMMUNITY)
Admission: EM | Admit: 2014-12-24 | Discharge: 2014-12-24 | Disposition: A | Discharge status: Home / Self Care | Attending: Emergency Medicine | Admitting: Emergency Medicine

## 2014-12-24 ENCOUNTER — Emergency Department (HOSPITAL_COMMUNITY)

## 2014-12-24 DIAGNOSIS — Y92128 Other place in nursing home as the place of occurrence of the external cause: Secondary | ICD-10-CM | POA: Insufficient documentation

## 2014-12-24 DIAGNOSIS — Z7982 Long term (current) use of aspirin: Secondary | ICD-10-CM | POA: Insufficient documentation

## 2014-12-24 DIAGNOSIS — Z79899 Other long term (current) drug therapy: Secondary | ICD-10-CM | POA: Insufficient documentation

## 2014-12-24 DIAGNOSIS — S0001XA Abrasion of scalp, initial encounter: Secondary | ICD-10-CM | POA: Insufficient documentation

## 2014-12-24 DIAGNOSIS — Z792 Long term (current) use of antibiotics: Secondary | ICD-10-CM | POA: Diagnosis not present

## 2014-12-24 DIAGNOSIS — F0390 Unspecified dementia without behavioral disturbance: Secondary | ICD-10-CM | POA: Diagnosis not present

## 2014-12-24 DIAGNOSIS — Y939 Activity, unspecified: Secondary | ICD-10-CM | POA: Insufficient documentation

## 2014-12-24 DIAGNOSIS — W19XXXA Unspecified fall, initial encounter: Secondary | ICD-10-CM | POA: Diagnosis not present

## 2014-12-24 DIAGNOSIS — S0990XA Unspecified injury of head, initial encounter: Secondary | ICD-10-CM | POA: Insufficient documentation

## 2014-12-24 DIAGNOSIS — I2510 Atherosclerotic heart disease of native coronary artery without angina pectoris: Secondary | ICD-10-CM | POA: Diagnosis not present

## 2014-12-24 DIAGNOSIS — Y999 Unspecified external cause status: Secondary | ICD-10-CM | POA: Diagnosis not present

## 2014-12-24 DIAGNOSIS — I4891 Unspecified atrial fibrillation: Secondary | ICD-10-CM | POA: Diagnosis not present

## 2014-12-24 DIAGNOSIS — Z951 Presence of aortocoronary bypass graft: Secondary | ICD-10-CM | POA: Diagnosis not present

## 2014-12-24 NOTE — ED Notes (Signed)
Bed: WA17 Expected date:  Expected time:  Means of arrival:  Comments: Fall, head laceration 

## 2014-12-24 NOTE — ED Provider Notes (Signed)
CSN: [id number]     Arrival date & time 12/24/14  0222 History   First MD Initiated Contact with Patient 12/24/14 0256     Chief Complaint  Patient presents with  . Head Injury  . Fall     (Consider location/radiation/quality/duration/timing/severity/associated sxs/prior Treatment) HPI Comments: Patient is an 81 year old female who presents from SNF at [facility] on the memory care unit after an unwitnessed fall that occurred prior to arrival. Patient is unable to provide history due to dementia. Staff from [facility] found the patient down with a bleeding wound on the left temporal scalp. She takes aspirin and no other blood thinners. Patient is acting at baseline. No other injuries noted.   Patient is a 81 y.o. female presenting with head injury and fall. The history is provided by the EMS personnel. No language interpreter was used.  Head Injury Location:  L temporal Time since incident:  1 hour Mechanism of injury: fall   Pain details:    Quality:  Unable to specify Chronicity:  New Relieved by:  Nothing Worsened by:  Nothing tried Ineffective treatments:  None tried Associated symptoms: no nausea, no neck pain and no vomiting   Fall Pertinent negatives include no abdominal pain, arthralgias, chest pain, chills, fatigue, fever, nausea, neck pain, vomiting or weakness.    Past Medical History  Diagnosis Date  . Dementia   . Anxiety   . Coronary artery disease   . Stroke   . A-fib    Past Surgical History  Procedure Laterality Date  . Coronary artery bypass graft  2002   Family History  Problem Relation Age of Onset  . Congestive Heart Failure Mother   . Alcoholism Father    History  Substance Use Topics  . Smoking status: Never Smoker   . Smokeless tobacco: Never Used  . Alcohol Use: No   OB History    No data available     Review of Systems  Unable to perform ROS: Dementia  Constitutional: Negative for fever, chills and fatigue.  HENT:  Negative for trouble swallowing.   Eyes: Negative for visual disturbance.  Respiratory: Negative for shortness of breath.   Cardiovascular: Negative for chest pain and palpitations.  Gastrointestinal: Negative for nausea, vomiting, abdominal pain and diarrhea.  Genitourinary: Negative for dysuria and difficulty urinating.  Musculoskeletal: Negative for arthralgias and neck pain.  Skin: Positive for wound. Negative for color change.  Neurological: Negative for dizziness and weakness.  Psychiatric/Behavioral: Negative for dysphoric mood.      Allergies  Review of patient's allergies indicates no known allergies.  Home Medications   Prior to Admission medications   Medication Sig Start Date End Date Taking? Authorizing Provider  acetaminophen (TYLENOL) 325 MG tablet Take 650 mg by mouth every morning.    Historical Provider, MD  aspirin 81 MG chewable tablet Chew 81 mg by mouth every morning.    Historical Provider, MD  bisacodyl (DULCOLAX) 10 MG suppository Place 10 mg rectally as needed (constipation.).    Historical Provider, MD  calcium-vitamin D (OSCAL WITH D) 500-200 MG-UNIT per tablet Take 1 tablet by mouth.    Historical Provider, MD  cephALEXin (KEFLEX) 500 MG capsule Take 1 capsule (500 mg total) by mouth 2 (two) times daily. 06/25/14   Blake Divine, MD  diltiazem (CARDIZEM CD) 300 MG 24 hr capsule Take 300 mg by mouth daily.    Historical Provider, MD  losartan (COZAAR) 50 MG tablet Take 50 mg by mouth daily.  Historical Provider, MD  Menthol-Methyl Salicylate (THERA-GESIC) 1-15 % CREA Apply 1 application topically at bedtime.    Historical Provider, MD  pravastatin (PRAVACHOL) 10 MG tablet Take 10 mg by mouth every evening.     Historical Provider, MD  sennosides-docusate sodium (SENOKOT-S) 8.6-50 MG tablet Take 1 tablet by mouth 2 (two) times daily.     Historical Provider, MD  Skin Protectants, Misc. (BAZA PROTECT EX) Apply topically as needed. As directed.    Historical  Provider, MD   BP 160/61 mmHg  Pulse 59  Temp(Src) 97.8 F (36.6 C) (Axillary)  Resp 20  SpO2 100% Physical Exam  Constitutional: She appears well-developed and well-nourished. No distress.  HENT:  Head: Normocephalic and atraumatic.  Abrasion noted to left temporal scalp with bleeding controlled at this time.   Eyes: Conjunctivae and EOM are normal.  Neck: Normal range of motion.  Cardiovascular: Normal rate and regular rhythm.  Exam reveals no gallop and no friction rub.   No murmur heard. Pulmonary/Chest: Effort normal and breath sounds normal. She has no wheezes. She has no rales. She exhibits no tenderness.  Abdominal: Soft. She exhibits no distension. There is no tenderness. There is no rebound.  Musculoskeletal: Normal range of motion.  Neurological: She is alert. Coordination normal.  Patient is nonverbal.   Skin: Skin is warm and dry.  Psychiatric: She has a normal mood and affect. Her behavior is normal.  Nursing note and vitals reviewed.   ED Course  Procedures (including critical care time) Labs Review Labs Reviewed - No data to display  Imaging Review Dg Chest 1 View  12/22/2014   CLINICAL DATA:  Fall today.  Dementia.  EXAM: CHEST  1 VIEW  COMPARISON:  Radiograph 02/22/2013  FINDINGS: Sternotomy wires overlie normal cardiac silhouette. No effusion, infiltrate, or pneumothorax. Mild central venous pulmonary congestion.  IMPRESSION: Mild central venous congestion.  No evidence of thoracic trauma   Electronically Signed   By: Genevive Bi M.D.   On: 12/22/2014 15:25   Dg Elbow Complete Right  12/22/2014   CLINICAL DATA:  Fall with right elbow laceration. Initial encounter.  EXAM: RIGHT ELBOW - COMPLETE 3+ VIEW  COMPARISON:  None.  FINDINGS: No fracture (minimal irregularity of the ventral radial neck in the lateral projection is not convincing). No dislocation or joint effusion. Osteopenia.  IMPRESSION: No acute osseous findings.   Electronically Signed   By:  Marnee Spring M.D.   On: 12/22/2014 15:25   Ct Head Wo Contrast  12/24/2014   CLINICAL DATA:  Unwitnessed fall  EXAM: CT HEAD WITHOUT CONTRAST  CT CERVICAL SPINE WITHOUT CONTRAST  TECHNIQUE: Multidetector CT imaging of the head and cervical spine was performed following the standard protocol without intravenous contrast. Multiplanar CT image reconstructions of the cervical spine were also generated.  COMPARISON:  06/25/2014  FINDINGS: CT HEAD FINDINGS  There is no intracranial hemorrhage, mass or evidence of acute infarction. There is moderately severe generalized atrophy and chronic microvascular ischemic change. There is no significant extra-axial fluid collection.  No acute intracranial findings are evident.  CT CERVICAL SPINE FINDINGS  The vertebral column, pedicles and facet articulations are intact. There is no evidence of acute fracture. No acute soft tissue abnormalities are evident.  Moderately severe degenerative disc and facet changes are present, greatest from C5 through C7.  IMPRESSION: 1. Negative for acute intracranial traumatic injury 2. Negative for acute cervical spine fracture 3. Moderately severe chronic atrophy and microvascular ischemic changes.   Electronically Signed  By: Ellery Plunkaniel R Mitchell M.D.   On: 12/24/2014 04:02   Ct Cervical Spine Wo Contrast  12/24/2014   CLINICAL DATA:  Unwitnessed fall  EXAM: CT HEAD WITHOUT CONTRAST  CT CERVICAL SPINE WITHOUT CONTRAST  TECHNIQUE: Multidetector CT imaging of the head and cervical spine was performed following the standard protocol without intravenous contrast. Multiplanar CT image reconstructions of the cervical spine were also generated.  COMPARISON:  06/25/2014  FINDINGS: CT HEAD FINDINGS  There is no intracranial hemorrhage, mass or evidence of acute infarction. There is moderately severe generalized atrophy and chronic microvascular ischemic change. There is no significant extra-axial fluid collection.  No acute intracranial findings are  evident.  CT CERVICAL SPINE FINDINGS  The vertebral column, pedicles and facet articulations are intact. There is no evidence of acute fracture. No acute soft tissue abnormalities are evident.  Moderately severe degenerative disc and facet changes are present, greatest from C5 through C7.  IMPRESSION: 1. Negative for acute intracranial traumatic injury 2. Negative for acute cervical spine fracture 3. Moderately severe chronic atrophy and microvascular ischemic changes.   Electronically Signed   By: Ellery Plunkaniel R Mitchell M.D.   On: 12/24/2014 04:02   Dg Hip Unilat With Pelvis 2-3 Views Right  12/22/2014   CLINICAL DATA:  Fall. Dementia with limited ability to report symptoms.  EXAM: RIGHT HIP (WITH PELVIS) 2-3 VIEWS  COMPARISON:  None.  FINDINGS: There is no convincing right hip, left hip, or pelvic ring fracture. Asymmetric right hip osteoarthritis with bulky osteophytes and axial joint narrowing.  Prominent rectal stool burden.  Incidental calcified fibroid in the right pelvis.  IMPRESSION: 1. No acute osseous findings. 2. Advanced right hip osteoarthritis. 3. Rectal distention by stool.   Electronically Signed   By: Marnee SpringJonathon  Watts M.D.   On: 12/22/2014 15:29     EKG Interpretation None      MDM   Final diagnoses:  Fall, initial encounter  Head injury, initial encounter    3:04 AM CT head and cervical spine pending. Vitals stable and patient afebrile.   4:09 AM Imaging unremarkable for acute changes. [facility].   [address]aitlyn OnawaySzekalski, PA-C 12/24/14 [phone]  Dione Boozeavid Glick, MD 12/24/14 [phone]

## 2014-12-24 NOTE — ED Notes (Signed)
Patient transported to CT 

## 2014-12-24 NOTE — ED Notes (Signed)
Pt presents from [facility] alzheimer's unit, report of unwitnessed fall, obvious head injury/laceration, EMS vitals unremarkable, report does not indicate pt being on blood thinners although hx review shows hx of a-fib. Bleeding on the head lac controlled at this time.

## 2014-12-24 NOTE — ED Notes (Signed)
[facility] notified of patient's need for transport back to [facility].

## 2014-12-24 NOTE — ED Notes (Signed)
Collar removed .

## 2015-08-16 IMAGING — CT CT CERVICAL SPINE W/O CM
2 of 3 series · 7 of 14 positions shown, 9 images · non-contrast
Comparison: 06/25/2014

CLINICAL DATA: Unwitnessed fall

EXAM:
CT HEAD WITHOUT CONTRAST
CT CERVICAL SPINE WITHOUT CONTRAST
TECHNIQUE: Multidetector CT imaging of the head and cervical spine was
performed following the standard protocol without intravenous
contrast. Multiplanar CT image reconstructions of the cervical spine
were also generated.

[Series 4: bone windows · axial · 0.43mm/px · z∈[-84,-33]mm · 2 of 52 slices shown]
[im 18/52  bone]
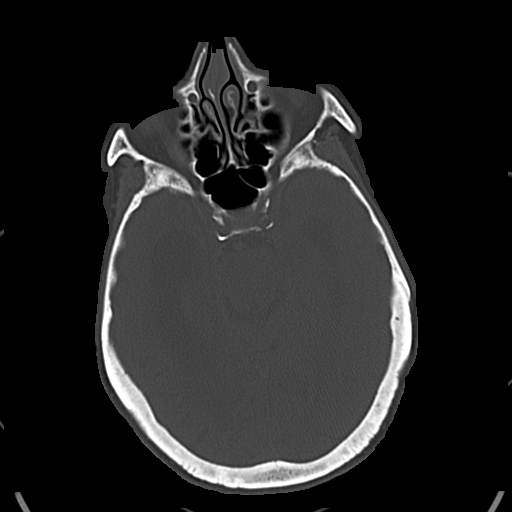
[im 35/52  bone]
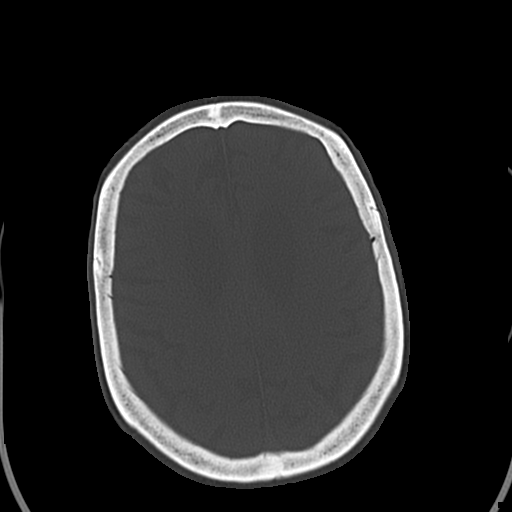

[Series 5: c-spine st · axial · 0.23mm/px · z∈[-247,-141]mm · 5 of 81 slices shown, 7 images]
[im 14/81  soft-tissue]
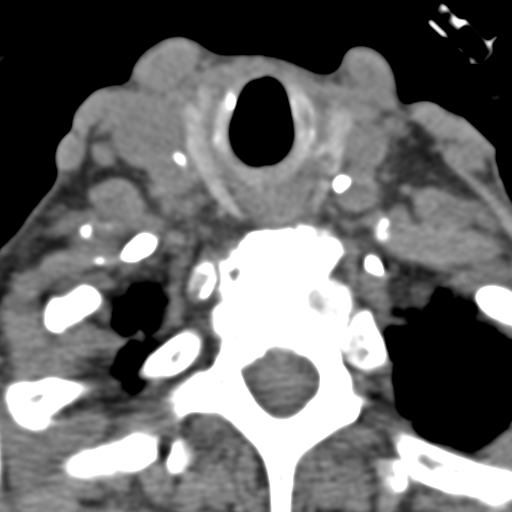
[im 14/81  bone]
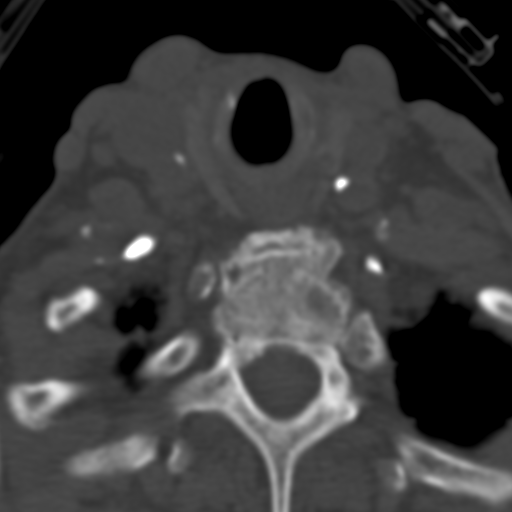
[im 27/81  bone]
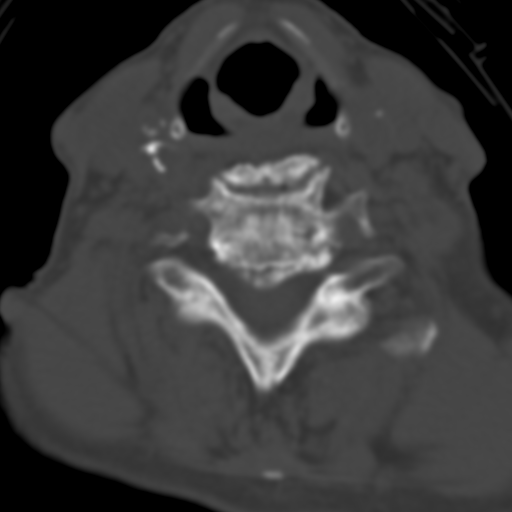
[im 41/81  bone]
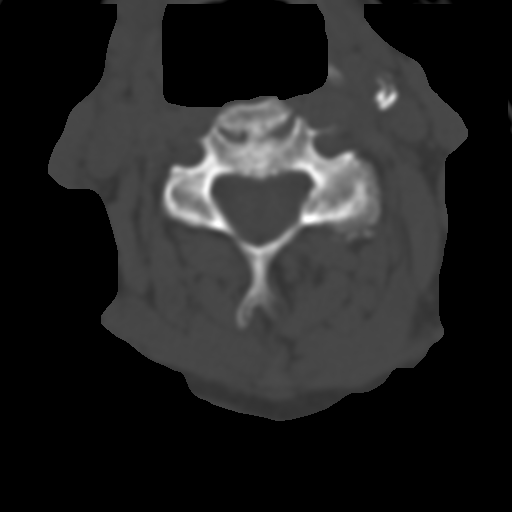
[im 54/81  bone]
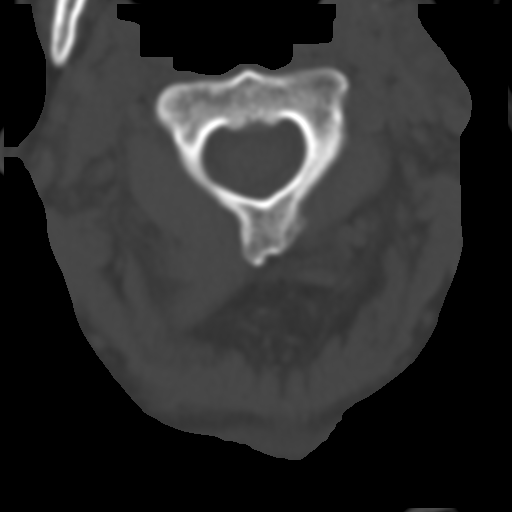
[im 67/81  soft-tissue]
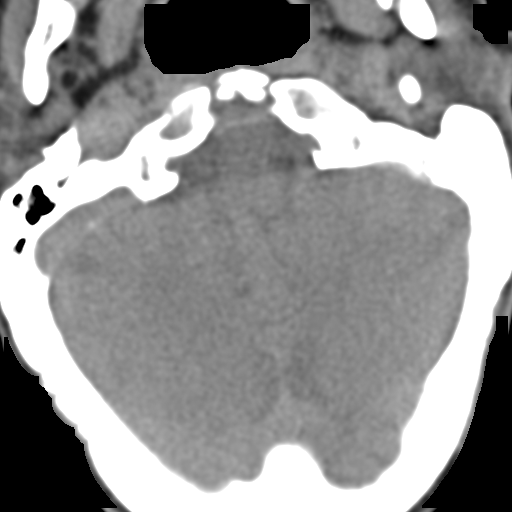
[im 67/81  bone]
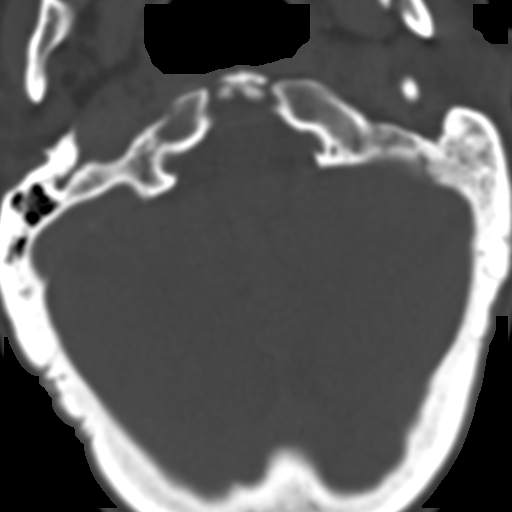

[7 of 14 positions shown; findings below may reference images not displayed]

FINDINGS: CT HEAD FINDINGS

There is no intracranial hemorrhage, mass or evidence of acute
infarction. There is moderately severe generalized atrophy and
chronic microvascular ischemic change. There is no significant
extra-axial fluid collection.

No acute intracranial findings are evident.

CT CERVICAL SPINE FINDINGS

The vertebral column, pedicles and facet articulations are intact.
There is no evidence of acute fracture. No acute soft tissue
abnormalities are evident.

Moderately severe degenerative disc and facet changes are present,
greatest from C5 through C7.
IMPRESSION: 1. Negative for acute intracranial traumatic injury
2. Negative for acute cervical spine fracture
3. Moderately severe chronic atrophy and microvascular ischemic
changes.

## 2016-04-05 DEATH — deceased
